# Patient Record
Sex: Female | Born: 1970 | Race: White | Hispanic: Yes | Marital: Married | Smoking: Never smoker
Health system: Southern US, Community
[De-identification: ages and names within clinical notes are randomized; demographics above are authoritative.]

## PROBLEM LIST (undated history)

## (undated) DIAGNOSIS — E119 Type 2 diabetes mellitus without complications: Secondary | ICD-10-CM

## (undated) DIAGNOSIS — IMO0001 Reserved for inherently not codable concepts without codable children: Secondary | ICD-10-CM

## (undated) HISTORY — DX: Type 2 diabetes mellitus without complications: E11.9

## (undated) HISTORY — DX: Reserved for inherently not codable concepts without codable children: IMO0001

---

## 2006-01-07 ENCOUNTER — Emergency Department (HOSPITAL_COMMUNITY): Admission: EM | Admit: 2006-01-07 | Discharge: 2006-01-08 | Payer: Self-pay | Admitting: Emergency Medicine

## 2006-01-29 ENCOUNTER — Emergency Department (HOSPITAL_COMMUNITY): Admission: EM | Admit: 2006-01-29 | Discharge: 2006-01-29 | Payer: Self-pay | Admitting: Emergency Medicine

## 2006-01-29 ENCOUNTER — Encounter (INDEPENDENT_AMBULATORY_CARE_PROVIDER_SITE_OTHER): Payer: Self-pay | Admitting: Specialist

## 2007-04-25 ENCOUNTER — Inpatient Hospital Stay (HOSPITAL_COMMUNITY): Admission: AD | Admit: 2007-04-25 | Discharge: 2007-04-25 | Payer: Self-pay | Admitting: Obstetrics and Gynecology

## 2007-04-29 ENCOUNTER — Inpatient Hospital Stay (HOSPITAL_COMMUNITY): Admission: AD | Admit: 2007-04-29 | Discharge: 2007-05-01 | Payer: Self-pay | Admitting: Obstetrics and Gynecology

## 2010-11-05 ENCOUNTER — Ambulatory Visit: Payer: Self-pay | Admitting: Internal Medicine

## 2010-11-05 DIAGNOSIS — N912 Amenorrhea, unspecified: Secondary | ICD-10-CM | POA: Insufficient documentation

## 2010-11-05 LAB — CONVERTED CEMR LAB: Beta hcg, urine, semiquantitative: POSITIVE

## 2010-12-24 LAB — ABO/RH: RH Type: POSITIVE

## 2010-12-24 LAB — HEPATITIS B SURFACE ANTIGEN: Hepatitis B Surface Ag: NEGATIVE

## 2010-12-24 LAB — TYPE AND SCREEN: Antibody Screen: NEGATIVE

## 2010-12-24 LAB — RUBELLA ANTIBODY, IGM: Rubella: IMMUNE

## 2010-12-24 LAB — HIV ANTIBODY (ROUTINE TESTING W REFLEX): HIV: NONREACTIVE

## 2010-12-27 NOTE — Assessment & Plan Note (Signed)
Summary: new to estab/cbs   Vital Signs:  Patient profile:   40 year old female Height:      63 inches Weight:      183 pounds BMI:     32.53 Pulse rate:   78 / minute Pulse rhythm:   regular BP sitting:   118 / 72  (left arm) Cuff size:   regular  Vitals Entered By: Army Fossa CMA (November 05, 2010 11:02 AM) CC: CPX, fasting Comments no pap, Pregnant, refer to gyn?    History of Present Illness: new patient  was supposed to get a CPX but just found out she is pregnant.Marland KitchenMarland KitchenLMP Oct 27 c/o chronic constipation uses OTCs   Preventive Screening-Counseling & Management  Alcohol-Tobacco     Smoking Status: never  Caffeine-Diet-Exercise     Does Patient Exercise: no      Drug Use:  no.        Blood Transfusions:  yes.    Current Medications (verified): 1)  None  Allergies (verified): No Known Drug Allergies  Past History:  Family History: Last updated: 11/05/2010 Hypertension-- M DM--no breast ca--cousin  colon ca--no  Social History: Last updated: 11/05/2010 married 4 children from [place] Never Smoked Alcohol use-no Drug use-no Regular exercise-no  Risk Factors: Exercise: no (11/05/2010)  Risk Factors: Smoking Status: never (11/05/2010)  Past Medical History: G5P4 Blood transfusion w/ first delivery   Past Surgical History: no major   Family History: Hypertension-- M DM--no breast ca--cousin  colon ca--no  Social History: married 4 children from [place] Never Smoked Alcohol use-no Drug use-no Regular exercise-no Blood Transfusions:  yes Smoking Status:  never Drug Use:  no Does Patient Exercise:  no  Review of Systems General:  Denies fatigue and fever. GI:  Denies bloody stools and vomiting; mild nausea . GU:  no vag d/c or bleed no abdominal cramps .  Physical Exam  General:  alert and well-developed.   Lungs:  normal respiratory effort, no intercostal retractions, no accessory muscle use, and normal breath  sounds.   Heart:  normal rate, regular rhythm, and no murmur.   Abdomen:  soft, no distention, no masses, no guarding, and no rigidity.  mild suprapubic dyscomfort w/o mass , rebound    Impression & Recommendations:  Problem # 1:  AMENORRHEA (ICD-626.0)  see instructions   Orders: Urine Pregnancy Test  (13086)  Problem # 2:  h/o constipation fluids , fiber intake encouraged: fruits- vegetables   Patient Instructions: 1)  please see Dr Senaida Ores as soon as possible, is very important that you het prenatal care  2)  call if you can't get an appointment  3)  start taking a pre natal vitamin  4)  vea a la doctora Richardson lo antes possible, es [employer] tenga su  control prenatal. 5)  Si no puede sacar una cita llamenos  6)  empieze a tomar una vitamina pre-natal diariamente    Orders Added: 1)  New Patient Level II [99202] 2)  Urine Pregnancy Test  [81025]     Risk Factors:  Tobacco use:  never Drug use:  no Alcohol use:  no Exercise:  no   Laboratory Results   Urine Tests      Urine HCG: positive Comments: Army Fossa CMA  November 05, 2010 2:46 PM

## 2011-03-09 ENCOUNTER — Ambulatory Visit (HOSPITAL_COMMUNITY)
Admission: RE | Admit: 2011-03-09 | Discharge: 2011-03-09 | Disposition: A | Discharge status: Home / Self Care | Payer: 59 | Source: Intra-hospital | Attending: Family Medicine | Admitting: Family Medicine

## 2011-03-09 DIAGNOSIS — M7989 Other specified soft tissue disorders: Secondary | ICD-10-CM | POA: Insufficient documentation

## 2011-03-09 DIAGNOSIS — M79609 Pain in unspecified limb: Secondary | ICD-10-CM | POA: Insufficient documentation

## 2011-03-18 ENCOUNTER — Ambulatory Visit (HOSPITAL_COMMUNITY)
Admission: RE | Admit: 2011-03-18 | Discharge: 2011-03-18 | Disposition: A | Discharge status: Home / Self Care | Payer: 59 | Source: Ambulatory Visit | Attending: Obstetrics and Gynecology | Admitting: Obstetrics and Gynecology

## 2011-03-18 ENCOUNTER — Ambulatory Visit (HOSPITAL_COMMUNITY): Payer: Self-pay

## 2011-03-18 DIAGNOSIS — M79609 Pain in unspecified limb: Secondary | ICD-10-CM | POA: Insufficient documentation

## 2011-03-18 DIAGNOSIS — I83893 Varicose veins of bilateral lower extremities with other complications: Secondary | ICD-10-CM | POA: Insufficient documentation

## 2011-03-18 DIAGNOSIS — M7989 Other specified soft tissue disorders: Secondary | ICD-10-CM | POA: Insufficient documentation

## 2011-03-18 LAB — RPR: RPR: NONREACTIVE

## 2011-04-12 NOTE — Discharge Summary (Signed)
NAMEMANNIE, WINELAND             ACCOUNT NO.:  [id number]   MEDICAL RECORD NO.:  [id number]          PATIENT TYPE:  INP   LOCATION:  9139                          FACILITY:  [facility]   PHYSICIAN:  Huel Cote, M.D. DATE OF BIRTH:  [birth year]   DATE OF ADMISSION:  04/29/2007  DATE OF DISCHARGE:  05/01/2007                               DISCHARGE SUMMARY   DISCHARGE DIAGNOSES:  1. A 41+ weeks' gestation delivered.  2. Macrosomic infant.  3. Status post normal spontaneous vaginal delivery with a shoulder      dystocia of a 10 pounds infant.   DISCHARGE MEDICATIONS:  Motrin 600 mg p.o. every 6 hours.   DISCHARGE FOLLOWUP:  The patient is to follow up in the office in 6  weeks for her routine postpartum exam.   HOSPITAL COURSE:  The patient is a 40 year old G5, P3-0-1-3 who was  admitted at 41+ weeks' gestation with spontaneous rupture of membranes  earlier that morning.  She had irregular contractions but was not in  labor.  Prenatal care had been complicated by Group B strep positive  status.  And, she had a history of gestational diabetes with her other  pregnancies but tested normal this pregnancy on a 3-hour test, although  her 1-hour Glucola was elevated.   PRENATAL LABORATORY:  O positive.  Antibody negative.  RPR nonreactive.  Rubella immune.  Hepatitis B surface antigen negative.  HIV nonreactive.  GC negative.  Chlamydia negative.  Group B strep positive.  One-hour  Glucola 163.  Three-hour Glucola within normal limits.   PAST OBSTETRICAL HISTORY:  1. She had a vaginal delivery in 2000 of an 8-pound infant.  2. In 2002, a vaginal delivery of a 7-pound infant.  3. In 2005, a vaginal delivery of an 8-pound infant.  4. She had one spontaneous loss.   PAST GYNECOLOGIC HISTORY:  None.   PAST MEDICAL HISTORY:  None.   PAST SURGICAL HISTORY:  None.   On admission she was afebrile with stable vital signs.  Fetal heart rate  was reassuring but was slightly tachycardic.   The cervix was 55 at a  minus 2 station.  She was placed on Pitocin to augment her labor and  reach complete dilation.  She pushed well with a normal spontaneous  vaginal delivery of a viable female infant over a 1st degree laceration.  There was a shoulder dystocia encountered which was relieved with  McRoberts' and a corkscrew maneuver.  Nuchal cord x1 was reduced over  the head.  Apgar's were 8 and 9.  Weight was 10 pounds even.  Placenta  delivered spontaneously.  Estimated blood loss was 450 mL with mild  atony of the uterus relieved with __________  and massage.  The 1st  degree laceration was repaired with one figure-of-8 suture of 0 Vicryl  for hemostasis.  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  Postpartum day #2, the patient was doing quite well.  The infant was  doing fine and she was felt stable for discharge home.  Her discharge  hemoglobin was normal and the patient  was instructed to follow up in the  office in 6 weeks.      Huel Cote, M.D.  Electronically Signed     KR/MEDQ  D:  06/08/2007  T:  06/08/2007  Job:  [id number]

## 2011-06-18 ENCOUNTER — Other Ambulatory Visit: Payer: Self-pay | Admitting: Obstetrics and Gynecology

## 2011-06-18 NOTE — H&P (Signed)
Amy Davidson is an 40 y.o. female. [id number] at [redacted] weeks gestation (EDD 06/26/2011 by LMP c/w 11 week [place]) presents for scheduled C-Section given a recent US on 06/14/11 which showed a macrosomic baby with an EFW of 10lbs 14oz.  Pt has a hx of an NSVD of a 10lb baby with a shoulder dystocia, and given the EFW >5000g it was felt not safe to proceed with a trial  Of labor.  [place] also showed significant polyhydramnios of 32.  Prenatal care significant for h/o gestational diabetes with last pregnancy, tested early and at 28 weeks this pregnancy and was WNL--pt  However, was still instructed to stay on a low carb diet.  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  She has been followed by NST's 2x weekly. She also tested positive for GBS early in pregnancy.    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 She does not have any smokeless tobacco history on file. Her alcohol and drug histories not on file.    ROS negative  Physical Exam CV RRR Lungs CTA bilaterally Abd  Gravid, FH 47cm Pelvic 3-4/50/-3   Assessment/Plan: Pt is 40 weeks with EFW of 10lbs 14oz  And h/o shoulder dystocia with 10lb baby.  D/w pt that [place] can be off by 20%, but that I really feel a trial of labor is just too risky given her h/o dystocia.  I discussed C/S risks and benefits  with pt and husband translated.  She understands the need  to proceed.  She does not want a tubal ligation.  Amy Davidson 06/18/2011, 9:57 PM

## 2011-06-21 ENCOUNTER — Encounter (HOSPITAL_COMMUNITY)
Admission: RE | Admit: 2011-06-21 | Discharge: 2011-06-21 | Disposition: A | Discharge status: Home / Self Care | Payer: BC Managed Care – PPO | Source: Ambulatory Visit | Attending: Obstetrics and Gynecology | Admitting: Obstetrics and Gynecology

## 2011-06-21 ENCOUNTER — Encounter (HOSPITAL_COMMUNITY): Payer: Self-pay

## 2011-06-21 LAB — CBC
HCT: 38.1 % (ref 36.0–46.0)
Hemoglobin: 13 g/dL (ref 12.0–15.0)
MCH: 31 pg (ref 26.0–34.0)
MCHC: 34.1 g/dL (ref 30.0–36.0)
MCV: 90.7 fL (ref 78.0–100.0)
Platelets: 94 10*3/uL — ABNORMAL LOW (ref 150–400)
RBC: 4.2 MIL/uL (ref 3.87–5.11)
RDW: 14.1 % (ref 11.5–15.5)
WBC: 5.2 10*3/uL (ref 4.0–10.5)

## 2011-06-21 LAB — SURGICAL PCR SCREEN
MRSA, PCR: INVALID — AB
Staphylococcus aureus: INVALID — AB

## 2011-06-21 LAB — RPR: RPR Ser Ql: NONREACTIVE

## 2011-06-21 NOTE — Patient Instructions (Addendum)
20 Amy Davidson  06/21/2011   Your procedure is scheduled on: 06/26/11   Report to [facility] at 1100 AM.  Call this number if you have problems the morning of surgery: [phone]   Remember:   Do not eat food:After Midnight.  Do not drink clear liquids: After Midnight.  Take these medicines the morning of surgery with A SIP OF WATER:NA   Do not wear jewelry, make-up or nail polish.  Do not bring valuables to the hospital.  Contacts, dentures or bridgework may not be worn into surgery.  Leave suitcase in the car. After surgery it may be brought to your room.  For patients admitted to the hospital, checkout time is 11:00 AM the day of discharge.   Patients discharged the day of surgery will not be allowed to drive home.  Name and phone number of your driver: NA  Special Instructions: use special wash 1/2 bottle the night before surgery and 1/2 bottle the morning of surgery  Please read over the following fact sheets that you were given: Pain Booklet and MRSA Information

## 2011-06-23 ENCOUNTER — Encounter (HOSPITAL_COMMUNITY): Payer: Self-pay | Admitting: Anesthesiology

## 2011-06-23 ENCOUNTER — Encounter (HOSPITAL_COMMUNITY)
Admission: AD | Disposition: A | Discharge status: Home / Self Care | Payer: Self-pay | Source: Ambulatory Visit | Attending: Obstetrics and Gynecology

## 2011-06-23 ENCOUNTER — Inpatient Hospital Stay (HOSPITAL_COMMUNITY)
Admission: AD | Admit: 2011-06-23 | Discharge: 2011-06-27 | DRG: 371 | Disposition: A | Discharge status: Home / Self Care | Payer: BC Managed Care – PPO | Source: Ambulatory Visit | Attending: Obstetrics and Gynecology | Admitting: Obstetrics and Gynecology

## 2011-06-23 ENCOUNTER — Encounter (HOSPITAL_COMMUNITY): Payer: Self-pay | Admitting: Obstetrics and Gynecology

## 2011-06-23 ENCOUNTER — Inpatient Hospital Stay (HOSPITAL_COMMUNITY): Payer: BC Managed Care – PPO | Admitting: Anesthesiology

## 2011-06-23 DIAGNOSIS — O3660X0 Maternal care for excessive fetal growth, unspecified trimester, not applicable or unspecified: Secondary | ICD-10-CM | POA: Diagnosis present

## 2011-06-23 DIAGNOSIS — Z01812 Encounter for preprocedural laboratory examination: Secondary | ICD-10-CM

## 2011-06-23 DIAGNOSIS — Z01818 Encounter for other preprocedural examination: Secondary | ICD-10-CM

## 2011-06-23 DIAGNOSIS — O09529 Supervision of elderly multigravida, unspecified trimester: Secondary | ICD-10-CM | POA: Diagnosis present

## 2011-06-23 DIAGNOSIS — O409XX0 Polyhydramnios, unspecified trimester, not applicable or unspecified: Secondary | ICD-10-CM | POA: Diagnosis present

## 2011-06-23 LAB — RPR: RPR Ser Ql: NONREACTIVE

## 2011-06-23 LAB — CBC
HCT: 39.6 % (ref 36.0–46.0)
Hemoglobin: 14 g/dL (ref 12.0–15.0)
MCH: 32.1 pg (ref 26.0–34.0)
MCHC: 35.4 g/dL (ref 30.0–36.0)
MCV: 90.8 fL (ref 78.0–100.0)
Platelets: 106 10*3/uL — ABNORMAL LOW (ref 150–400)
RBC: 4.36 MIL/uL (ref 3.87–5.11)
RDW: 14.2 % (ref 11.5–15.5)
WBC: 7.5 10*3/uL (ref 4.0–10.5)

## 2011-06-23 SURGERY — Surgical Case
Anesthesia: Regional | Site: Abdomen | Wound class: Clean Contaminated

## 2011-06-23 MED ORDER — MEPERIDINE HCL 25 MG/ML IJ SOLN: 6.2500 mg | INTRAMUSCULAR | Status: DC | PRN

## 2011-06-23 MED ORDER — SIMETHICONE 80 MG PO CHEW
80.0000 mg | CHEWABLE_TABLET | Freq: Three times a day (TID) | ORAL | Status: DC
  Administered 2011-06-23 – 2011-06-27 (×12): 80 mg via ORAL

## 2011-06-23 MED ORDER — ACETAMINOPHEN 10 MG/ML IV SOLN
1000.0000 mg | Freq: Four times a day (QID) | INTRAVENOUS | Status: DC | PRN
Start: 2011-06-23 — End: 2011-06-23

## 2011-06-23 MED ORDER — ACETAMINOPHEN 325 MG PO TABS
325.0000 mg | ORAL_TABLET | ORAL | Status: DC | PRN
  Administered 2011-06-26: 325 mg via ORAL
  Filled 2011-06-23: qty 1

## 2011-06-23 MED ORDER — MEASLES, MUMPS & RUBELLA VAC ~~LOC~~ INJ: 0.5000 mL | INJECTION | Freq: Once | SUBCUTANEOUS | Status: DC

## 2011-06-23 MED ORDER — ONDANSETRON HCL 4 MG/2ML IJ SOLN: 4.0000 mg | INTRAMUSCULAR | Status: DC | PRN

## 2011-06-23 MED ORDER — METHYLERGONOVINE MALEATE 0.2 MG/ML IJ SOLN
INTRAMUSCULAR | Status: AC
  Administered 2011-06-23: 0.2 mg via INTRAMUSCULAR
  Filled 2011-06-23: qty 1

## 2011-06-23 MED ORDER — SODIUM CHLORIDE 0.9 % IR SOLN
Status: DC | PRN
  Administered 2011-06-23: 1000 mL

## 2011-06-23 MED ORDER — BUPIVACAINE IN DEXTROSE 0.75-8.25 % IT SOLN
INTRATHECAL | Status: DC | PRN
  Administered 2011-06-23: 11 mg via INTRATHECAL

## 2011-06-23 MED ORDER — KETOROLAC TROMETHAMINE 30 MG/ML IJ SOLN: 30.0000 mg | Freq: Four times a day (QID) | INTRAMUSCULAR | Status: DC | PRN

## 2011-06-23 MED ORDER — CEFAZOLIN SODIUM-DEXTROSE 2-3 GM-% IV SOLR
2.0000 g | INTRAVENOUS | Status: AC
  Administered 2011-06-23: 2 g via INTRAVENOUS

## 2011-06-23 MED ORDER — MENTHOL 3 MG MT LOZG: 1.0000 | LOZENGE | OROMUCOSAL | Status: DC | PRN

## 2011-06-23 MED ORDER — IBUPROFEN 600 MG PO TABS: 600.0000 mg | ORAL_TABLET | Freq: Four times a day (QID) | ORAL | Status: DC | PRN

## 2011-06-23 MED ORDER — LACTATED RINGERS IV SOLN: INTRAVENOUS | Status: DC | PRN

## 2011-06-23 MED ORDER — MAGNESIUM HYDROXIDE 400 MG/5ML PO SUSP: 30.0000 mL | ORAL | Status: DC | PRN

## 2011-06-23 MED ORDER — ONDANSETRON HCL 4 MG PO TABS: 4.0000 mg | ORAL_TABLET | ORAL | Status: DC | PRN

## 2011-06-23 MED ORDER — NALBUPHINE SYRINGE 5 MG/0.5 ML: 5.0000 mg | INJECTION | INTRAMUSCULAR | Status: AC | PRN

## 2011-06-23 MED ORDER — PROMETHAZINE HCL 25 MG/ML IJ SOLN: 6.2500 mg | INTRAMUSCULAR | Status: DC | PRN

## 2011-06-23 MED ORDER — FAMOTIDINE IN NACL 20-0.9 MG/50ML-% IV SOLN
INTRAVENOUS | Status: AC
  Administered 2011-06-23: 20 mg via INTRAVENOUS
  Filled 2011-06-23: qty 50

## 2011-06-23 MED ORDER — DIPHENHYDRAMINE HCL 50 MG/ML IJ SOLN: 12.5000 mg | INTRAMUSCULAR | Status: DC | PRN

## 2011-06-23 MED ORDER — ACETAMINOPHEN 10 MG/ML IV SOLN: 1000.0000 mg | Freq: Four times a day (QID) | INTRAVENOUS | Status: AC | PRN

## 2011-06-23 MED ORDER — HYDROMORPHONE HCL 1 MG/ML IJ SOLN: 0.2500 mg | INTRAMUSCULAR | Status: DC | PRN

## 2011-06-23 MED ORDER — CITRIC ACID-SODIUM CITRATE 334-500 MG/5ML PO SOLN
ORAL | Status: AC
  Administered 2011-06-23: 30 mL via ORAL
  Filled 2011-06-23: qty 15

## 2011-06-23 MED ORDER — IBUPROFEN 600 MG PO TABS
600.0000 mg | ORAL_TABLET | Freq: Four times a day (QID) | ORAL | Status: DC
  Administered 2011-06-26 – 2011-06-27 (×3): 600 mg via ORAL
  Filled 2011-06-23 (×4): qty 1

## 2011-06-23 MED ORDER — DIPHENHYDRAMINE HCL 50 MG/ML IJ SOLN: 25.0000 mg | INTRAMUSCULAR | Status: DC | PRN

## 2011-06-23 MED ORDER — METOCLOPRAMIDE HCL 5 MG/ML IJ SOLN: 10.0000 mg | Freq: Three times a day (TID) | INTRAMUSCULAR | Status: DC | PRN

## 2011-06-23 MED ORDER — DIPHENHYDRAMINE HCL 25 MG PO CAPS: 25.0000 mg | ORAL_CAPSULE | ORAL | Status: DC | PRN

## 2011-06-23 MED ORDER — PRENATAL PLUS 27-1 MG PO TABS
1.0000 | ORAL_TABLET | Freq: Every day | ORAL | Status: DC
  Administered 2011-06-24 – 2011-06-27 (×4): 1 via ORAL
  Filled 2011-06-23 (×4): qty 1

## 2011-06-23 MED ORDER — ACETAMINOPHEN 325 MG PO TABS: 325.0000 mg | ORAL_TABLET | ORAL | Status: DC | PRN

## 2011-06-23 MED ORDER — ONDANSETRON HCL 4 MG/2ML IJ SOLN: 4.0000 mg | Freq: Three times a day (TID) | INTRAMUSCULAR | Status: DC | PRN

## 2011-06-23 MED ORDER — LACTATED RINGERS IV SOLN
INTRAVENOUS | Status: DC | PRN
  Administered 2011-06-23 (×3): via INTRAVENOUS

## 2011-06-23 MED ORDER — WITCH HAZEL-GLYCERIN EX PADS: MEDICATED_PAD | CUTANEOUS | Status: DC | PRN

## 2011-06-23 MED ORDER — OXYTOCIN 20 UNITS IN LACTATED RINGERS INFUSION - SIMPLE
INTRAVENOUS | Status: DC | PRN
  Administered 2011-06-23: 20 [IU] via INTRAVENOUS

## 2011-06-23 MED ORDER — NALBUPHINE HCL 10 MG/ML IJ SOLN: 5.0000 mg | INTRAMUSCULAR | Status: DC | PRN

## 2011-06-23 MED ORDER — OXYCODONE-ACETAMINOPHEN 5-325 MG PO TABS
1.0000 | ORAL_TABLET | ORAL | Status: DC | PRN
  Administered 2011-06-24: 1 via ORAL
  Administered 2011-06-24: 2 via ORAL
  Administered 2011-06-24: 1 via ORAL
  Administered 2011-06-24 (×2): 2 via ORAL
  Administered 2011-06-25 (×2): 1 via ORAL
  Administered 2011-06-25 – 2011-06-26 (×3): 2 via ORAL
  Administered 2011-06-26 – 2011-06-27 (×2): 1 via ORAL
  Filled 2011-06-23 (×6): qty 2
  Filled 2011-06-23 (×4): qty 1
  Filled 2011-06-23: qty 2
  Filled 2011-06-23: qty 1

## 2011-06-23 MED ORDER — SODIUM CHLORIDE 0.9 % IJ SOLN: 3.0000 mL | INTRAMUSCULAR | Status: DC | PRN

## 2011-06-23 MED ORDER — SCOPOLAMINE 1 MG/3DAYS TD PT72: 1.0000 | MEDICATED_PATCH | Freq: Once | TRANSDERMAL | Status: DC

## 2011-06-23 MED ORDER — PANTOPRAZOLE SODIUM 40 MG PO TBEC: 40.0000 mg | DELAYED_RELEASE_TABLET | Freq: Once | ORAL | Status: DC | PRN

## 2011-06-23 MED ORDER — KETOROLAC TROMETHAMINE 30 MG/ML IJ SOLN: 30.0000 mg | Freq: Four times a day (QID) | INTRAMUSCULAR | Status: AC | PRN

## 2011-06-23 MED ORDER — EPHEDRINE SULFATE 50 MG/ML IJ SOLN
INTRAMUSCULAR | Status: DC | PRN
  Administered 2011-06-23: 80 mg via INTRAVENOUS
  Administered 2011-06-23: 40 mg via INTRAVENOUS
  Administered 2011-06-23 (×3): 10 mg via INTRAVENOUS

## 2011-06-23 MED ORDER — PHENYLEPHRINE HCL 10 MG/ML IJ SOLN
INTRAMUSCULAR | Status: DC | PRN
  Administered 2011-06-23 (×2): 80 ug via INTRAVENOUS
  Administered 2011-06-23: 120 ug via INTRAVENOUS
  Administered 2011-06-23 (×4): 80 ug via INTRAVENOUS
  Administered 2011-06-23: 40 ug via INTRAVENOUS

## 2011-06-23 MED ORDER — SODIUM CHLORIDE 0.9 % IV SOLN: 1.0000 ug/kg/h | INTRAVENOUS | Status: DC | PRN

## 2011-06-23 MED ORDER — MORPHINE SULFATE (PF) 0.5 MG/ML IJ SOLN
INTRAMUSCULAR | Status: DC | PRN
  Administered 2011-06-23 (×2): 1 mg via INTRAVENOUS
  Administered 2011-06-23: .9 mg via INTRAVENOUS
  Administered 2011-06-23: 1 mg via INTRAVENOUS
  Administered 2011-06-23: .1 mg via INTRATHECAL
  Administered 2011-06-23: 1 mg via INTRAVENOUS

## 2011-06-23 MED ORDER — FAMOTIDINE 20 MG PO TABS: 20.0000 mg | ORAL_TABLET | Freq: Once | ORAL | Status: DC | PRN

## 2011-06-23 MED ORDER — ACETAMINOPHEN 10 MG/ML IV SOLN: 1000.0000 mg | Freq: Once | INTRAVENOUS | Status: DC | PRN

## 2011-06-23 MED ORDER — SIMETHICONE 80 MG PO CHEW
80.0000 mg | CHEWABLE_TABLET | ORAL | Status: DC | PRN
  Administered 2011-06-24: 80 mg via ORAL

## 2011-06-23 MED ORDER — METHYLERGONOVINE MALEATE 0.2 MG/ML IJ SOLN
0.2000 mg | Freq: Once | INTRAMUSCULAR | Status: AC
  Administered 2011-06-23: 0.2 mg via INTRAMUSCULAR

## 2011-06-23 MED ORDER — DIPHENHYDRAMINE HCL 25 MG PO CAPS: 25.0000 mg | ORAL_CAPSULE | Freq: Four times a day (QID) | ORAL | Status: DC | PRN

## 2011-06-23 MED ORDER — ONDANSETRON HCL 4 MG/2ML IJ SOLN
INTRAMUSCULAR | Status: DC | PRN
  Administered 2011-06-23: 4 mg via INTRAVENOUS

## 2011-06-23 MED ORDER — TETANUS-DIPHTH-ACELL PERTUSSIS 5-2.5-18.5 LF-MCG/0.5 IM SUSP
0.5000 mL | Freq: Once | INTRAMUSCULAR | Status: AC
  Administered 2011-06-24: 0.5 mL via INTRAMUSCULAR
  Filled 2011-06-23: qty 0.5

## 2011-06-23 MED ORDER — ZOLPIDEM TARTRATE 5 MG PO TABS: 5.0000 mg | ORAL_TABLET | Freq: Every evening | ORAL | Status: DC | PRN

## 2011-06-23 MED ORDER — ACETAMINOPHEN 10 MG/ML IV SOLN: 1000.0000 mg | Freq: Once | INTRAVENOUS | Status: AC | PRN

## 2011-06-23 MED ORDER — NALOXONE HCL 0.4 MG/ML IJ SOLN: 0.4000 mg | INTRAMUSCULAR | Status: DC | PRN

## 2011-06-23 MED ORDER — SENNOSIDES-DOCUSATE SODIUM 8.6-50 MG PO TABS
1.0000 | ORAL_TABLET | Freq: Every day | ORAL | Status: DC
  Administered 2011-06-23 – 2011-06-25 (×3): 2 via ORAL
  Administered 2011-06-26: 1 via ORAL

## 2011-06-23 MED ORDER — FENTANYL CITRATE 0.05 MG/ML IJ SOLN
INTRAMUSCULAR | Status: DC | PRN
  Administered 2011-06-23: 75 ug via INTRAVENOUS
  Administered 2011-06-23: 25 ug via INTRATHECAL

## 2011-06-23 MED ORDER — METHYLERGONOVINE MALEATE 0.2 MG/ML IJ SOLN: 0.2000 mg | INTRAMUSCULAR | Status: DC | PRN

## 2011-06-23 MED ORDER — LACTATED RINGERS IV SOLN
INTRAVENOUS | Status: DC
  Administered 2011-06-24: 01:00:00 via INTRAVENOUS

## 2011-06-23 MED ORDER — OXYTOCIN 20 UNITS IN LACTATED RINGERS INFUSION - SIMPLE: 125.0000 mL/h | INTRAVENOUS | Status: AC

## 2011-06-23 MED ORDER — OXYTOCIN 20 UNITS IN LACTATED RINGERS INFUSION - SIMPLE
INTRAVENOUS | Status: AC
  Administered 2011-06-23: 18:00:00
  Filled 2011-06-23: qty 1000

## 2011-06-23 MED ORDER — METHYLERGONOVINE MALEATE 0.2 MG PO TABS: 0.2000 mg | ORAL_TABLET | ORAL | Status: DC | PRN

## 2011-06-23 MED ORDER — CITRIC ACID-SODIUM CITRATE 334-500 MG/5ML PO SOLN: 30.0000 mL | Freq: Once | ORAL | Status: DC | PRN

## 2011-06-23 MED ORDER — MIDAZOLAM HCL 2 MG/2ML IJ SOLN: 0.5000 mg | INTRAMUSCULAR | Status: DC | PRN

## 2011-06-23 SURGICAL SUPPLY — 34 items
CHLORAPREP W/TINT 26ML (MISCELLANEOUS) ×2 IMPLANT
CLOTH BEACON ORANGE TIMEOUT ST (SAFETY) ×2 IMPLANT
CONTAINER PREFILL 10% NBF 15ML (MISCELLANEOUS) IMPLANT
DRAPE UTILITY XL STRL (DRAPES) IMPLANT
DRSG COVADERM 4X10 (GAUZE/BANDAGES/DRESSINGS) ×2 IMPLANT
ELECT REM PT RETURN 9FT ADLT (ELECTROSURGICAL) ×2
ELECTRODE REM PT RTRN 9FT ADLT (ELECTROSURGICAL) ×1 IMPLANT
EXTRACTOR VACUUM KIWI (MISCELLANEOUS) IMPLANT
EXTRACTOR VACUUM M CUP 4 TUBE (SUCTIONS) IMPLANT
GLOVE BIO SURGEON STRL SZ8 (GLOVE) ×2 IMPLANT
GLOVE ORTHO TXT STRL SZ7.5 (GLOVE) ×2 IMPLANT
GLOVE SKINSENSE NS SZ6.5 (GLOVE) ×2
GLOVE SKINSENSE STRL SZ6.5 (GLOVE) ×2 IMPLANT
GOWN PREVENTION PLUS LG XLONG (DISPOSABLE) ×4 IMPLANT
GOWN STRL REIN XL XLG (GOWN DISPOSABLE) ×2 IMPLANT
KIT ABG SYR 3ML LUER SLIP (SYRINGE) IMPLANT
NEEDLE HYPO 25X5/8 SAFETYGLIDE (NEEDLE) ×2 IMPLANT
NS IRRIG 1000ML POUR BTL (IV SOLUTION) ×2 IMPLANT
PACK C SECTION WH (CUSTOM PROCEDURE TRAY) ×2 IMPLANT
RTRCTR C-SECT PINK 25CM LRG (MISCELLANEOUS) ×2 IMPLANT
SLEEVE SCD COMPRESS KNEE MED (MISCELLANEOUS) ×2 IMPLANT
STAPLER VISISTAT 35W (STAPLE) ×2 IMPLANT
SUT CHROMIC 1 CTX 36 (SUTURE) ×4 IMPLANT
SUT PLAIN 0 NONE (SUTURE) IMPLANT
SUT PLAIN 2 0 XLH (SUTURE) ×2 IMPLANT
SUT PROLENE 4 0 KS NEEDLE (SUTURE) IMPLANT
SUT VIC AB 0 CT1 27 (SUTURE) ×2
SUT VIC AB 0 CT1 27XBRD ANBCTR (SUTURE) ×2 IMPLANT
SUT VIC AB 2-0 SH 27 (SUTURE) ×1
SUT VIC AB 2-0 SH 27XBRD (SUTURE) ×1 IMPLANT
TAPE CLOTH SURG 4X10 WHT LF (GAUZE/BANDAGES/DRESSINGS) ×2 IMPLANT
TOWEL OR 17X24 6PK STRL BLUE (TOWEL DISPOSABLE) ×2 IMPLANT
TRAY FOLEY CATH 14FR (SET/KITS/TRAYS/PACK) ×2 IMPLANT
WATER STERILE IRR 1000ML POUR (IV SOLUTION) ×2 IMPLANT

## 2011-06-23 NOTE — H&P (Addendum)
Amy Davidson is a 40 y.o. female presenting for regular ctx.  Scheduled for c-section on Aug 1 for suspected fetal macrosomia, EFW on 7-20 10 lbs 14 oz by u/s.  . Maternal Medical History:  Reason for admission: Reason for admission: contractions.  Contractions: Onset was 1-2 hours ago.   Frequency: regular.    Fetal activity: Perceived fetal activity is normal.    Prenatal complications: Polyhydramnios.   Suspected fetal macrosomia    OB History    Grav Para Term Preterm Abortions TAB SAB Ect Mult Living   5 4 4       4      Past Medical History  Diagnosis Date  . Diabetes mellitus     with first preg only   Past Surgical History  Procedure Date  . No past surgeries    Family History: family history is not on file. Social History:  reports that she has never smoked. She has never used smokeless tobacco. She reports that she does not drink alcohol or use illicit drugs.  ROS- normal complaints of pregnancy  Dilation: 7.5 Effacement (%): 90 Station: -1 Exam by:: J.RAschRN bag of water felt on cervical exam  Blood pressure 105/71, pulse 87, temperature 98.1 F (36.7 C), temperature source Oral, resp. rate 20, height 5\' 3"  (1.6 m), weight 97.977 kg (216 lb). Maternal Exam:  Abdomen: Patient reports no abdominal tenderness. Fundal height is large.   Estimated fetal weight is 10-11 lbs.   Fetal presentation: vertex  Introitus: Normal vulva. Normal vagina.  Ferning test: not done.  Nitrazine test: not done. Amniotic fluid character: not assessed.     Physical Exam  Constitutional: She appears well-developed and well-nourished.  Neck: Neck supple. No thyromegaly present.  Cardiovascular: Normal rate and regular rhythm.   Respiratory: Effort normal and breath sounds normal.    Prenatal labs: ABO, Rh:  O pos  Antibody:  neg Rubella:  Imm RPR: NON REACTIVE (07/27 1212)  HBsAg:   Neg HIV:   Neg GBS:  Neg   Assessment/Plan: IUP at 39 weeks in active labor.   Suspected fetal macrosomia, scheduled for c-section.  Will proceed with c-section.  Husband as interpreter says she understands.     Amy Davidson D 06/23/2011, 1:36 PM

## 2011-06-23 NOTE — Plan of Care (Signed)
Problem: Consults Goal: Postpartum Patient Education (See Patient Education module for education specifics.) Outcome: Progressing w/interpreter  Problem: Phase I Progression Outcomes Goal: Pain controlled with appropriate interventions Outcome: Progressing w/interpreter Goal: Voiding adequately Outcome: Progressing F/c in place Goal: IS, TCDB as ordered Outcome: Progressing Teaching done w/interpreter

## 2011-06-23 NOTE — Progress Notes (Signed)
Dr. Jackelyn Knife notified of cervical exam. Dr. Is going into surgery and will plan to take pt for a c-section when finished. Pt has C-section scheduled for Aug 1st. Pt is in labor

## 2011-06-23 NOTE — Anesthesia Postprocedure Evaluation (Signed)
  Anesthesia Post-op Note  Patient: Amy Davidson  Procedure(s) Performed:  CESAREAN SECTION - Primary cesarean section with delivery of baby boy at 38. Apgars 8/9.  Patient Location: PACU  Anesthesia Type: Spinal  Level of Consciousness: awake  Airway and Oxygen Therapy: Patient Spontanous Breathing  Post-op Pain: none  Post-op Assessment: Post-op Vital signs reviewed, Patient's Cardiovascular Status Stable and Respiratory Function Stable  Post-op Vital Signs: stable  Complications: No apparent anesthesia complications

## 2011-06-23 NOTE — Anesthesia Procedure Notes (Addendum)
Spinal Block  Patient location during procedure: OR Start time: 06/23/2011 2:44 PM Staffing Anesthesiologist: Brayton Caves R Performed by: anesthesiologist  Preanesthetic Checklist Completed: patient identified, site marked, surgical consent, pre-op evaluation, timeout performed, IV checked, risks and benefits discussed and monitors and equipment checked Spinal Block Patient position: sitting Prep: DuraPrep Patient monitoring: heart rate, cardiac monitor, continuous pulse ox and blood pressure Approach: midline Location: L3-4 Injection technique: single-shot Needle Needle type: Sprotte  Needle gauge: 24 G Needle length: 9 cm Assessment Sensory level: T4 Additional Notes Patient identified.  Risk benefits discussed including failed block, incomplete pain control, headache, nerve damage, paralysis, blood pressure changes, nausea, vomiting, reactions to medication both toxic or allergic, and postpartum back pain.  Patient expressed understanding and wished to proceed.  All questions were answered.  Sterile technique used throughout procedure.  CSF was clear.  No parasthesia or other complications.  Please see nursing notes for vital signs.

## 2011-06-23 NOTE — Anesthesia Preprocedure Evaluation (Deleted)
Anesthesia Evaluation  General Assessment Comment  Airway       Dental   Pulmonary      Cardiovascular    Neuro/Psych  GI/Hepatic/Renal   Endo/Other   (+) Diabetes mellitus-  Abdominal   Musculoskeletal  Hematology   Peds  Reproductive/Obstetrics   Anesthesia Other Findings             Anesthesia Physical Anesthesia Plan Anesthesia Quick Evaluation

## 2011-06-23 NOTE — Anesthesia Preprocedure Evaluation (Addendum)
Anesthesia Evaluation  Name, MR# and DOB Patient awake  General Assessment Comment  Reviewed: Allergy & Precautions, H&P  and Patient's Chart, lab work & pertinent test results  Airway Mallampati: III TM Distance: >3 FB Neck ROM: full    Dental No notable dental hx    Pulmonaryneg pulmonary ROS    clear to auscultation  pulmonary exam normal   Cardiovascular regular Normal   Neuro/PsychNegative Neurological ROS Negative Psych ROS  GI/Hepatic/Renal negative GI ROS, negative Liver ROS, and negative Renal ROS (+)       Endo/Other  Negative Endocrine ROS (+) Diabetes mellitus- Morbid obesity Abdominal   Musculoskeletal  Hematology negative hematology ROS (+)   Peds  Reproductive/Obstetrics (+) Pregnancy   Anesthesia Other Findings             Anesthesia Physical Anesthesia Plan  ASA: III  Anesthesia Plan: Spinal   Post-op Pain Management:    Induction:   Airway Management Planned:   Additional Equipment:   Intra-op Plan:   Post-operative Plan:   Informed Consent: I have reviewed the patients History and Physical, chart, labs and discussed the procedure including the risks, benefits and alternatives for the proposed anesthesia with the patient or authorized representative who has indicated his/her understanding and acceptance.     Plan Discussed with:   Anesthesia Plan Comments:        Anesthesia Quick Evaluation

## 2011-06-23 NOTE — Transfer of Care (Signed)
Immediate Anesthesia Transfer of Care Note  Patient: Amy Davidson  Procedure(s) Performed:  CESAREAN SECTION - Primary cesarean section with delivery of baby boy at 19. Apgars 8/9.  Patient Location: PACU  Anesthesia Type: Spinal  Level of Consciousness: awake, alert  and oriented  Airway & Oxygen Therapy: Patient Spontanous Breathing  Post-op Assessment: Report given to PACU RN and Post -op Vital signs reviewed and stable  Post vital signs: stable  Complications: No apparent anesthesia complications

## 2011-06-23 NOTE — Procedures (Signed)
Preoperative diagnosis: Intrauterine pregnancy at 39 weeks, suspected fetal macrosomia Postoperative diagnosis: Intrauterine pregnancy at 39 weeks, fetal macrosomia Procedure: Primary low transverse cesarean section without extensions Surgeon: Lavina Hamman M.D. Anesthesia: Spinal Findings: Normal gravid anatomy and delivery of a viable female infant with Apgars of 8 and 9 the weight 12 lbs. 6 oz. in the vertex presentation Specimens: Placenta sent for to labor and delivery Estimated blood loss: 800 cc Complications: None Procedure in detail:  Patient was taken to the operating room and placed in the sitting position. Dr. Brayton Caves instilled spinal anesthesia and she was then placed in the dorsosupine position with left lateral tilt. Abdomen was prepped and draped in the usual sterile fashion and a Foley catheter was inserted. The level of her anesthesia was found to be adequate. Abdomen was entered via a standard Pfannenstiel incision without difficulty. The Alexis disposable self-retaining retractor was placed and good visualization was achieved. A 4 cm transverse incision was then made in the lower uterine segment pushing the bladder inferior. Once the uterine cavity was entered the incision was extended digitally. Membranes were ruptured revealing copious light meconium-stained fluid. The fetal vertex was at the incision. Fetal vertex was delivered atraumatically. Mouth and nares were suctioned. The remainder of the infant then delivered atraumatically. Cord was doubly clamped and cut and the infant handed to the awaiting pediatric team. Cord blood was obtained. The placenta delivered spontaneously. Uterus was wiped dry with clean lap pad and all clots and debris were removed. Uterine incision was inspected and found to be free of extensions. Uterine incision was closed in 2 layers. The first layer was a running locking layer with #1 chromic the second layer was an imbricating layer also with #1  chromic. Bleeding from 2 separate spots was controlled with interrupted figure-of-eight sutures of 3-0 Vicryl this achieved hemostasis. Tubes and ovaries were inspected and found to be normal. Blood and fluid were removed from each paracolic gutter. The uterine incision was inspected and found to be hemostatic. The Alexis retractor was removed. The subfascial space was irrigated and made hemostatic with electrocautery. Fascia was closed in running fashion starting at both ends and meeting in the middle with 0 Vicryl. Subcutaneous tissue was irrigated and made hemostatic with the cautery. Subcutaneous tissue was closed with running 2-0 plain gut suture. Skin was then closed with staples followed by a sterile dressing. Patient tolerated the procedure well and was taken to the recovery room in stable condition. Counts were correct x2, she received Ancef 2 g IV the beginning of the procedure and she had PAS hose on throughout the procedure.

## 2011-06-23 NOTE — Anesthesia Postprocedure Evaluation (Signed)
  Anesthesia Post-op Note  Patient: Amy Davidson  Procedure(s) Performed:  CESAREAN SECTION - Primary cesarean section with delivery of baby boy at 74. Apgars 8/9. Patient's cardiopulmonary status is stable Patient's level of consciousness: sedate but responsive verbally Pain and nausea are all reasonably controlled No anesthetic complications apparent at this time No follow up care necessary at this time

## 2011-06-23 NOTE — ED Notes (Signed)
Awaiting platelet count results.

## 2011-06-23 NOTE — Progress Notes (Signed)
Pt states her contractions started this morning at 10:00. Husband at bedside assisting with interpretation. Interpretor called.

## 2011-06-24 LAB — CBC
HCT: 32.1 % — ABNORMAL LOW (ref 36.0–46.0)
Hemoglobin: 10.7 g/dL — ABNORMAL LOW (ref 12.0–15.0)
MCH: 30.6 pg (ref 26.0–34.0)
MCHC: 33.3 g/dL (ref 30.0–36.0)
MCV: 91.7 fL (ref 78.0–100.0)
Platelets: 83 10*3/uL — ABNORMAL LOW (ref 150–400)
RBC: 3.5 MIL/uL — ABNORMAL LOW (ref 3.87–5.11)
RDW: 14.6 % (ref 11.5–15.5)
WBC: 16.9 10*3/uL — ABNORMAL HIGH (ref 4.0–10.5)

## 2011-06-24 LAB — MRSA CULTURE

## 2011-06-24 NOTE — Progress Notes (Signed)
Subjective: Postpartum Day#1: Cesarean Delivery Patient reports tolerating PO.  Received IM Methergine in PACU, now on PO Methergine for bleeding which has improved.  Objective: Vital signs in last 24 hours: Temp:  [97 F (36.1 C)-98.5 F (36.9 C)] 97.8 F (36.6 C) (07/30 0800) Pulse Rate:  [56-102] 89  (07/30 0800) Resp:  [16-24] 20  (07/30 0800) BP: (98-154)/(58-89) 110/75 mmHg (07/30 0800) SpO2:  [96 %-99 %] 96 % (07/30 0800) Weight:  [97.977 kg (216 lb)] 216 lb (97.977 kg) (07/29 1317)  Physical Exam:  General: alert Uterine Fundus: firm Incision: dressing C/D/I .   Basename 06/24/11 0505 06/23/11 1312  HGB 10.7* 14.0  HCT 32.1* 39.6    Assessment/Plan: Status post Cesarean section. Doing well postoperatively.  Continue current care, ambulate.  Chala Gul D 06/24/2011, 8:53 AM

## 2011-06-25 MED ORDER — BISACODYL 10 MG RE SUPP
10.0000 mg | Freq: Once | RECTAL | Status: AC
  Administered 2011-06-25: 10 mg via RECTAL

## 2011-06-25 NOTE — Progress Notes (Signed)
POD#2 c/s  Doing ok, pain ok Afeb, VSS Abd- soft, fundus firm, NT at U+2, dressing C/D/I Continue routine care, may shower and remove bandage today

## 2011-06-25 NOTE — Progress Notes (Signed)
Mom recently fed infant at 0947 Breast 10 min and bottle afterwards. Will f/u for latch check.

## 2011-06-26 ENCOUNTER — Encounter (HOSPITAL_COMMUNITY): Admission: RE | Payer: Self-pay | Source: Ambulatory Visit

## 2011-06-26 ENCOUNTER — Encounter (HOSPITAL_COMMUNITY): Payer: Self-pay | Admitting: Obstetrics and Gynecology

## 2011-06-26 ENCOUNTER — Inpatient Hospital Stay (HOSPITAL_COMMUNITY)
Admission: RE | Admit: 2011-06-26 | Payer: BC Managed Care – PPO | Source: Ambulatory Visit | Admitting: Obstetrics and Gynecology

## 2011-06-26 DIAGNOSIS — O3660X0 Maternal care for excessive fetal growth, unspecified trimester, not applicable or unspecified: Secondary | ICD-10-CM | POA: Diagnosis present

## 2011-06-26 SURGERY — Surgical Case
Anesthesia: *Unknown

## 2011-06-26 SURGERY — Surgical Case
Anesthesia: Regional

## 2011-06-26 NOTE — Progress Notes (Signed)
06/26/2011 Amy Davidson  Interpreter  I assisted Dr. Ronalee Red with explanation of plan of care for the [facility]

## 2011-06-26 NOTE — Progress Notes (Signed)
POD#3 c-section Doing well Tm 100.8 at 2145 yesterday, Afeb now, VSS Abd- soft, fundus firm, incision intact-no erythema Due to fever last pm will monitor today for further fever.  Will need abx if has another temp >100.4.

## 2011-06-26 NOTE — Progress Notes (Signed)
With Dad translating, Mom reports that BF is going well,. Experienced BF Mom.  No questions at present. To call prn

## 2011-06-26 NOTE — Progress Notes (Signed)
06/26/2011 Amy Davidson  Interpreter  I assisted [name] with questions

## 2011-06-27 MED ORDER — OXYCODONE-ACETAMINOPHEN 5-325 MG PO TABS: 1.0000 | ORAL_TABLET | ORAL | Status: AC | PRN

## 2011-06-27 MED ORDER — IBUPROFEN 600 MG PO TABS: 600.0000 mg | ORAL_TABLET | Freq: Four times a day (QID) | ORAL | Status: AC

## 2011-06-27 NOTE — Progress Notes (Signed)
  POD#4 c-section No problems Afeb, VSS Abd- soft, fundus firm, incision intact, no erythema Discharge home

## 2011-06-27 NOTE — Discharge Summary (Signed)
Obstetric Discharge Summary Reason for Admission: onset of labor Prenatal Procedures: NST Intrapartum Procedures: cesarean: low cervical, transverse Postpartum Procedures: none Complications-Operative and Postpartum: none Hemoglobin  Date Value Range Status  06/24/2011 10.7* 12.0-15.0 (g/dL) Final     DELTA CHECK NOTED     REPEATED TO VERIFY     HCT  Date Value Range Status  06/24/2011 32.1* 36.0-46.0 (%) Final    Discharge Diagnoses: Term Pregnancy-delivered  Discharge Information: Date: 06/27/2011 Activity: pelvic rest and no strenuous activity Diet: routine Medications: Ibuprophen and Percocet Condition: stable Instructions: refer to practice specific booklet Discharge to: home Follow-up Information    Follow up with Amy Davidson. Make an appointment in 2 weeks.   Contact information:   [address], [address] [place] [address] [phone]          Newborn Data: Live born female  Birth Weight: 12 lb 6.2 oz (5619 g) APGAR: 8, 9  Home with mother.  Amy Davidson 06/27/2011, 8:19 AM

## 2011-08-09 ENCOUNTER — Encounter (HOSPITAL_COMMUNITY): Payer: Self-pay | Admitting: *Deleted

## 2011-08-20 ENCOUNTER — Encounter (HOSPITAL_COMMUNITY): Payer: Self-pay | Admitting: Obstetrics and Gynecology

## 2011-09-12 LAB — CBC
HCT: 33.9 — ABNORMAL LOW
Hemoglobin: 11.4 — ABNORMAL LOW
MCHC: 33.6
MCV: 91.5
Platelets: 150
RBC: 3.71 — ABNORMAL LOW
RDW: 14
WBC: 12.5 — ABNORMAL HIGH

## 2011-09-12 LAB — RPR: RPR Ser Ql: NONREACTIVE

## 2012-05-04 ENCOUNTER — Telehealth: Payer: Self-pay | Admitting: Internal Medicine

## 2012-05-04 ENCOUNTER — Encounter: Payer: Self-pay | Admitting: Internal Medicine

## 2012-05-04 ENCOUNTER — Ambulatory Visit (INDEPENDENT_AMBULATORY_CARE_PROVIDER_SITE_OTHER): Payer: BC Managed Care – PPO | Admitting: Internal Medicine

## 2012-05-04 VITALS — BP 130/80 | HR 75 | Temp 98.9°F | Wt 184.0 lb

## 2012-05-04 DIAGNOSIS — R079 Chest pain, unspecified: Secondary | ICD-10-CM

## 2012-05-04 DIAGNOSIS — R002 Palpitations: Secondary | ICD-10-CM

## 2012-05-04 NOTE — Telephone Encounter (Signed)
Noted  

## 2012-05-04 NOTE — Assessment & Plan Note (Addendum)
Presents today with CP, palpitations, very low CV risk profile Exam is (-), EKG NSR w/ a pause. Sx are associated w/ some anxiety. Plan: Labs Observation, we discussed need to control anxiety but doesn't have persistent sx thus SSRI are not  indicated necessarily; she is not on BCP thus i'm reluctant to use benzos. rec counseling and call if sx increase

## 2012-05-04 NOTE — Progress Notes (Signed)
  Subjective:    Patient ID: Amy Davidson, female    DOB: November 21, 1971, 41 y.o.   MRN: [id number]  HPI Acute visit Had a single episode of chest pain 5 days ago, it lasted one hour, it happened at night, no radiation, located at the left side of the chest and resolved spontaneously. There was no associated shortness of breath, diaphoresis, nausea or vomiting. After the chest pain resolved, her hands got cold, she felt really tired and she felt that she could pass out. No loss of consciousness. She admits that that night she was more anxious than usual. No further events.   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 Blood transfusion w/ first delivery   Past Surgical History: c section  Family History: Hypertension-- M DM--no CAD-- no Stroke-- no breast ca--cousin  colon ca--no  Social History: Married, 5 children, from [place] Never Smoked Alcohol use-no Drug use-no   Review of Systems Denies fever or chills No cough  No abdominal pain No recent airplane trips, no lower extremity edema or calf pain  The patient reports that in the past, there is times that she wakes up feeling scared and has palpitations, symptoms are on and off and going on for a while; these episodes are not associated w/ other sx     Objective:   Physical Exam General -- alert, well-developed, and overweight appearing. No apparent distress.  Neck --no thyromegaly Lungs -- normal respiratory effort, no intercostal retractions, no accessory muscle use, and normal breath sounds.   Heart-- normal rate, regular rhythm, no murmur, and no gallop.   Abdomen--soft, non-tender, no distention, no masses, no HSM, no guarding, and no rigidity.   Extremities-- no pretibial edema bilaterally  Neurologic-- alert & oriented X3 and strength normal in all extremities. Psych-- Cognition and judgment appear intact. Alert and cooperative with normal attention span and concentration.  not anxious appearing and not  depressed appearing.       Assessment & Plan:

## 2012-05-04 NOTE — Telephone Encounter (Signed)
Emergent [facility] Caller: Trinna Post Alsaro/Child; PCP: Willow Ora; CB#: [phone]; Call regarding Chest Pain/Chest Discomfort; onset 04/29/12, heart was racing, with shoulder pain, follows cleaning, no change in activity. states lasted ~ 1 hr, none since. Last OV during pregnancy over 10 months ago for problems with varicose veins, states was told to go for testing. Symptoms reviewed Chest Pain Guideline, with symptoms last week, appt 1530 05/04/12 with Dr Drue Novel. Pt agrees to appt.

## 2012-05-05 LAB — CBC WITH DIFFERENTIAL/PLATELET
Basophils Absolute: 0 10*3/uL (ref 0.0–0.1)
Basophils Relative: 0.4 % (ref 0.0–3.0)
Eosinophils Absolute: 0.1 10*3/uL (ref 0.0–0.7)
Eosinophils Relative: 1.9 % (ref 0.0–5.0)
HCT: 38.6 % (ref 36.0–46.0)
Hemoglobin: 12.6 g/dL (ref 12.0–15.0)
Lymphocytes Relative: 35.5 % (ref 12.0–46.0)
Lymphs Abs: 2.4 10*3/uL (ref 0.7–4.0)
MCHC: 32.7 g/dL (ref 30.0–36.0)
MCV: 90.3 fl (ref 78.0–100.0)
Monocytes Absolute: 0.4 10*3/uL (ref 0.1–1.0)
Monocytes Relative: 6.2 % (ref 3.0–12.0)
Neutro Abs: 3.8 10*3/uL (ref 1.4–7.7)
Neutrophils Relative %: 56 % (ref 43.0–77.0)
Platelets: 191 10*3/uL (ref 150.0–400.0)
RBC: 4.27 Mil/uL (ref 3.87–5.11)
RDW: 13.6 % (ref 11.5–14.6)
WBC: 6.8 10*3/uL (ref 4.5–10.5)

## 2012-05-05 LAB — TSH: TSH: 1.91 u[IU]/mL (ref 0.35–5.50)

## 2012-05-05 LAB — BASIC METABOLIC PANEL
BUN: 12 mg/dL (ref 6–23)
CO2: 29 mEq/L (ref 19–32)
Calcium: 9.2 mg/dL (ref 8.4–10.5)
Chloride: 108 mEq/L (ref 96–112)
Creatinine, Ser: 0.7 mg/dL (ref 0.4–1.2)
GFR: 101.15 mL/min (ref >60.00)
Glucose, Bld: 90 mg/dL (ref 70–99)
Potassium: 3.9 mEq/L (ref 3.5–5.1)
Sodium: 142 mEq/L (ref 135–145)

## 2012-05-06 ENCOUNTER — Encounter: Payer: Self-pay | Admitting: Internal Medicine

## 2012-05-06 NOTE — Assessment & Plan Note (Signed)
See "CP"

## 2012-10-15 ENCOUNTER — Encounter: Payer: Self-pay | Admitting: Family Medicine

## 2012-10-15 ENCOUNTER — Ambulatory Visit (INDEPENDENT_AMBULATORY_CARE_PROVIDER_SITE_OTHER): Payer: BC Managed Care – PPO | Admitting: Family Medicine

## 2012-10-15 VITALS — BP 108/72 | HR 74 | Temp 98.1°F | Wt 183.2 lb

## 2012-10-15 DIAGNOSIS — S43499A Other sprain of unspecified shoulder joint, initial encounter: Secondary | ICD-10-CM

## 2012-10-15 DIAGNOSIS — S46819A Strain of other muscles, fascia and tendons at shoulder and upper arm level, unspecified arm, initial encounter: Secondary | ICD-10-CM

## 2012-10-15 MED ORDER — CYCLOBENZAPRINE HCL 10 MG PO TABS: 10.0000 mg | ORAL_TABLET | Freq: Three times a day (TID) | ORAL | Status: DC | PRN

## 2012-10-15 NOTE — Patient Instructions (Addendum)
Muscle Strain Muscle strain occurs when a muscle is stretched beyond its normal length. A small number of muscle fibers generally are torn. This is especially common in athletes. This happens when a sudden, violent force placed on a muscle stretches it too far. Usually, recovery from muscle strain takes 1 to 2 weeks. Complete healing will take 5 to 6 weeks.  HOME CARE INSTRUCTIONS   While awake, apply ice to the sore muscle for the first 2 days after the injury.  Put ice in a plastic bag.  Place a towel between your skin and the bag.  Leave the ice on for 15 to 20 minutes each hour.  Do not use the strained muscle for several days, until you no longer have pain.  You may wrap the injured area with an elastic bandage for comfort. Be careful not to wrap it too tightly. This may interfere with blood circulation or increase swelling.  Only take over-the-counter or prescription medicines for pain, discomfort, or fever as directed by your caregiver. SEEK MEDICAL CARE IF:  You have increasing pain or swelling in the injured area. MAKE SURE YOU:   Understand these instructions.  Will watch your condition.  Will get help right away if you are not doing well or get worse. Document Released: 11/11/2005 Document Revised: 02/03/2012 Document Reviewed: 11/23/2011 [facility] Patient Information 2013 [place], [place].     Distensin muscular (Muscle Strain)  Un esguince muscular se produce cuando un msculo se estira ms all de su largo normal. Generalmente causa la ruptura de algunas fibras musculares. Es muy frecuente [employer]. Ocurre cuando se aplica una fuerza violenta y sbita sobre un msculo y ste se Public relations account executive. En general, la recuperacin de un esguince muscular demora entre 1 y 2 semanas. La curacin completa se produce luego de 5 a 6 semanas.  INSTRUCCIONES PARA EL CUIDADO DOMICILIARIO  Mientras se encuentre despierto, aplique hielo sobre el msculo dolorido durante los  primeros 2 das luego de la lesin.  Ponga el hielo en una bolsa plstica.  Colquese una toalla entre la piel y la bolsa de hielo.  Deje el hielo durante 15 a 20 minutos por hora.  No use el msculo que sufri el esguince durante [address], hasta que no sienta ms dolor.  Puede vendarse la zona lesionada con una venda elstica para obtener mayor comodidad. Tenga cuidado de no ajustarla demasiado. Esto puede interferir con la circulacin sangunea o aumentar la hinchazn.  Slo tome medicamentos de venta libre o recetados para Primary school teacher, las molestias o bajar la fiebre segn las indicaciones de su mdico. SOLICITE ATENCIN MDICA SI: El dolor o la hinchazn en la zona afectada aumentan. EST SEGURO QUE:   Comprende las instrucciones para el alta mdica.  Controlar su enfermedad.  Solicitar atencin mdica de inmediato segn las indicaciones. Document Released: 08/21/2005 Document Revised: 02/03/2012 [facility] Patient Information 2013 [place], [place].

## 2012-10-16 DIAGNOSIS — S46819A Strain of other muscles, fascia and tendons at shoulder and upper arm level, unspecified arm, initial encounter: Secondary | ICD-10-CM | POA: Insufficient documentation

## 2012-10-16 NOTE — Progress Notes (Signed)
  Subjective:    Patient ID: Amy Davidson, female    DOB: 1971/11/06, 41 y.o.   MRN: [id number]  HPIpt here with translater c/o pain in R trap and upper right back.  No known injury.  Pt lifts a lot of heavy things at work.  No numbness or tingling.      Review of Systems As above    Objective:   Physical Exam  BP 108/72  Pulse 74  Temp 98.1 F (36.7 C) (Oral)  Wt 183 lb 3.2 oz (83.099 kg)  SpO2 99% General appearance: alert, cooperative, appears stated age and no distress Heart: S1, S2 normal Extremities: extremities normal, atraumatic, no cyanosis or edema Neurologic: Alert and oriented X 3, normal strength and tone. Normal symmetric reflexes. Normal coordination and gait Ms --+ muscle spasms R Trap--- myofascial tech used to attempt to release--- not successful      Assessment & Plan:

## 2012-10-16 NOTE — Assessment & Plan Note (Signed)
Flexeril  nsaids Heat  Massage Pt would like to see a chiropractor--- referral put in

## 2013-05-12 ENCOUNTER — Encounter: Payer: Self-pay | Admitting: *Deleted

## 2013-05-12 ENCOUNTER — Ambulatory Visit (INDEPENDENT_AMBULATORY_CARE_PROVIDER_SITE_OTHER): Payer: BC Managed Care – PPO | Admitting: Internal Medicine

## 2013-05-12 VITALS — BP 114/78 | HR 80 | Temp 98.1°F | Wt 184.0 lb

## 2013-05-12 DIAGNOSIS — J029 Acute pharyngitis, unspecified: Secondary | ICD-10-CM

## 2013-05-12 LAB — POCT RAPID STREP A (OFFICE): Rapid Strep A Screen: NEGATIVE

## 2013-05-12 NOTE — Patient Instructions (Addendum)
Rest, fluids , tylenol  Call if no better in few days Call anytime if the symptoms are severe -- [place], tome muchos liquidos y tylenol para el dolor de garganta si no esta mejor en unos dias o si se pone peor: llame a la clinica

## 2013-05-12 NOTE — Progress Notes (Signed)
  Subjective:    Patient ID: Amy Davidson, female    DOB: 1971/01/30, 42 y.o.   MRN: [id number]  HPI Acute visit Sore throat for the last several days, the first 2 days the pain was intense and she had fever. This morning the pain has decrease, no further fevers. Her son was sick with what seemed to be an upper respiratory infection , he is already getting better. She also mentioned her R face "feels funny" but denies pain per se, see review of systems.  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 No chest congestion. No nausea vomiting. Mild headache. Denies any visual disturbances.     Objective:   Physical Exam BP 114/78  Pulse 80  Temp(Src) 98.1 F (36.7 C) (Oral)  Wt 184 lb (83.462 kg)  BMI 32.6 kg/m2  SpO2 98% \ General -- alert, well-developed, NAD   Neck --FROM HEENT -- TMs normal, throat w/o redness, Uvula midline, pharynx walls symmetric, face symmetric and not tender to palpation,nose not congested   Lungs -- normal respiratory effort, no intercostal retractions, no accessory muscle use, and normal breath sounds.   Heart-- normal rate, regular rhythm, no murmur, and no gallop.    Neurologic-- alert & oriented X3; Speech, gait and motor are normal. Face symmetric. EOMI, pupils equal and reactive. Psych-- Cognition and judgment appear intact. Alert and cooperative with normal attention span and concentration.  not anxious appearing and not depressed appearing.        Assessment & Plan:  Sore throat, Strep test negative, will send a throat culture and treat if positive. She has some ill-defined face discomfort,  neurological exam is normal, No evidence of sinusitis on clinical grounds,she has no rash that I can tell. Recommend observation, see instructions

## 2013-05-13 ENCOUNTER — Encounter: Payer: Self-pay | Admitting: Internal Medicine

## 2013-05-14 ENCOUNTER — Encounter: Payer: Self-pay | Admitting: *Deleted

## 2013-05-14 LAB — CULTURE, GROUP A STREP: Organism ID, Bacteria: NORMAL

## 2014-02-17 ENCOUNTER — Telehealth: Payer: Self-pay

## 2014-02-17 NOTE — Telephone Encounter (Signed)
Patient confirmed--language barrier--unable to obtain pre visit information Tdap--05/2011

## 2014-02-18 ENCOUNTER — Encounter: Payer: Self-pay | Admitting: Internal Medicine

## 2014-02-18 ENCOUNTER — Ambulatory Visit (INDEPENDENT_AMBULATORY_CARE_PROVIDER_SITE_OTHER): Payer: BC Managed Care – PPO | Admitting: Internal Medicine

## 2014-02-18 VITALS — BP 111/69 | HR 69 | Temp 98.2°F | Ht 63.1 in | Wt 186.0 lb

## 2014-02-18 DIAGNOSIS — Z0000 Encounter for general adult medical examination without abnormal findings: Secondary | ICD-10-CM

## 2014-02-18 LAB — LIPID PANEL
Cholesterol: 206 mg/dL — ABNORMAL HIGH (ref 0–200)
HDL: 42 mg/dL (ref >39.00)
LDL Cholesterol: 144 mg/dL — ABNORMAL HIGH (ref 0–99)
Total CHOL/HDL Ratio: 5
Triglycerides: 102 mg/dL (ref 0.0–149.0)
VLDL: 20.4 mg/dL (ref 0.0–40.0)

## 2014-02-18 LAB — CBC WITH DIFFERENTIAL/PLATELET
Basophils Absolute: 0 10*3/uL (ref 0.0–0.1)
Basophils Relative: 0.7 % (ref 0.0–3.0)
Eosinophils Absolute: 0.2 10*3/uL (ref 0.0–0.7)
Eosinophils Relative: 2.9 % (ref 0.0–5.0)
HCT: 36.4 % (ref 36.0–46.0)
Hemoglobin: 12 g/dL (ref 12.0–15.0)
Lymphocytes Relative: 34.3 % (ref 12.0–46.0)
Lymphs Abs: 1.8 10*3/uL (ref 0.7–4.0)
MCHC: 32.9 g/dL (ref 30.0–36.0)
MCV: 87.8 fl (ref 78.0–100.0)
Monocytes Absolute: 0.3 10*3/uL (ref 0.1–1.0)
Monocytes Relative: 5.7 % (ref 3.0–12.0)
Neutro Abs: 3 10*3/uL (ref 1.4–7.7)
Neutrophils Relative %: 56.4 % (ref 43.0–77.0)
Platelets: 184 10*3/uL (ref 150.0–400.0)
RBC: 4.14 Mil/uL (ref 3.87–5.11)
RDW: 14.8 % — ABNORMAL HIGH (ref 11.5–14.6)
WBC: 5.4 10*3/uL (ref 4.5–10.5)

## 2014-02-18 LAB — COMPREHENSIVE METABOLIC PANEL
ALT: 13 U/L (ref 0–35)
AST: 17 U/L (ref 0–37)
Albumin: 4.2 g/dL (ref 3.5–5.2)
Alkaline Phosphatase: 58 U/L (ref 39–117)
BUN: 14 mg/dL (ref 6–23)
CO2: 27 mEq/L (ref 19–32)
Calcium: 8.8 mg/dL (ref 8.4–10.5)
Chloride: 104 mEq/L (ref 96–112)
Creatinine, Ser: 0.7 mg/dL (ref 0.4–1.2)
GFR: 95.41 mL/min (ref >60.00)
Glucose, Bld: 83 mg/dL (ref 70–99)
Potassium: 3.7 mEq/L (ref 3.5–5.1)
Sodium: 138 mEq/L (ref 135–145)
Total Bilirubin: 0.4 mg/dL (ref 0.3–1.2)
Total Protein: 6.8 g/dL (ref 6.0–8.3)

## 2014-02-18 LAB — TSH: TSH: 1.63 u[IU]/mL (ref 0.35–5.50)

## 2014-02-18 NOTE — Assessment & Plan Note (Signed)
Td 2012  Last gyn ~ 02-2013, Dr Senaida Oresichardson, has an appointment for f/u already Labs Diet-exercise discussed  RTC 1 year

## 2014-02-18 NOTE — Progress Notes (Signed)
   Subjective:    Patient ID: Amy Davidson, female    DOB: 1971-02-19, 43 y.o.   MRN: [id number]  DOS:  02/18/2014 Type of  visit: CPX   ROS Diet-- healthy Exercise-- active, no routine exercise  No  CP, SOB No palpitations, no lower extremity edema Denies  nausea, vomiting diarrhea Denies  blood in the stools (-) cough, sputum production (-) wheezing, chest congestion No dysuria, gross hematuria, difficulty urinating  No anxiety, depression    Past Medical History  Diagnosis Date  . Gestational diabetes     with first preg only  . Contraception     none    Past Surgical History  Procedure Laterality Date  . Cesarean section  06/23/2011    Procedure: CESAREAN SECTION;  Surgeon: Leighton Roachodd D Meisinger;  Location: [facility];  Service: Gynecology;  Laterality: N/A;  Primary cesarean section with delivery of baby boy at [id number]. Apgars 8/9.    History   Social History  . Marital Status: Married    Spouse Name: N/A    Number of Children: 5  . Years of Education: N/A   Occupational History  . stay home    Social History Main Topics  . Smoking status: Never Smoker   . Smokeless tobacco: Never Used  . Alcohol Use: No  . Drug Use: No  . Sexual Activity:    Other Topics Concern  . Not on file   Social History Narrative   Married, lives w/ husband and 5 children, from [place]     Family History: Hypertension-- M DM--no CAD-- no Stroke-- no breast ca--cousin   colon ca--no        Medication List    Notice As of 02/18/2014  5:14 PM   You have not been prescribed any medications.         Objective:   Physical Exam BP 111/69  Pulse 69  Temp(Src) 98.2 F (36.8 C)  Ht 5' 3.1" (1.603 m)  Wt 186 lb (84.369 kg)  BMI 32.83 kg/m2  SpO2 98%  General -- alert, well-developed, NAD.  Neck --no thyromegaly , normal carotid pulse  HEENT-- Not pale.  Lungs -- normal respiratory effort, no intercostal retractions, no accessory muscle use, and normal breath  sounds.  Heart-- normal rate, regular rhythm, no murmur.  Abdomen-- Not distended, good bowel sounds,soft, non-tender.  Extremities-- no pretibial edema bilaterally  Neurologic--  alert & oriented X3. Speech normal, gait normal, strength normal in all extremities.  Psych-- Cognition and judgment appear intact. Cooperative with normal attention span and concentration. No anxious or depressed appearing.      Assessment & Plan:     \

## 2014-02-18 NOTE — Patient Instructions (Signed)
Get your blood work before you leave   Next visit is for a physical exam in 1 year, fasting Please make an appointment    

## 2014-02-18 NOTE — Progress Notes (Signed)
Pre visit review using our clinic review tool, if applicable. No additional management support is needed unless otherwise documented below in the visit note. 

## 2014-02-21 ENCOUNTER — Encounter: Payer: Self-pay | Admitting: *Deleted

## 2014-05-18 ENCOUNTER — Other Ambulatory Visit: Payer: Self-pay | Admitting: Obstetrics and Gynecology

## 2014-05-18 DIAGNOSIS — R928 Other abnormal and inconclusive findings on diagnostic imaging of breast: Secondary | ICD-10-CM

## 2014-05-20 ENCOUNTER — Ambulatory Visit: Payer: BC Managed Care – PPO | Admitting: Internal Medicine

## 2014-05-25 LAB — HM PAP SMEAR

## 2014-05-30 ENCOUNTER — Ambulatory Visit
Admission: RE | Admit: 2014-05-30 | Discharge: 2014-05-30 | Disposition: A | Discharge status: Home / Self Care | Payer: BC Managed Care – PPO | Source: Ambulatory Visit | Attending: Obstetrics and Gynecology | Admitting: Obstetrics and Gynecology

## 2014-05-30 DIAGNOSIS — R928 Other abnormal and inconclusive findings on diagnostic imaging of breast: Secondary | ICD-10-CM

## 2014-09-26 ENCOUNTER — Encounter: Payer: Self-pay | Admitting: Internal Medicine

## 2014-12-07 ENCOUNTER — Ambulatory Visit (INDEPENDENT_AMBULATORY_CARE_PROVIDER_SITE_OTHER): Payer: BLUE CROSS/BLUE SHIELD | Admitting: Emergency Medicine

## 2014-12-07 VITALS — BP 118/72 | HR 109 | Temp 101.4°F | Resp 18 | Ht 62.5 in | Wt 190.0 lb

## 2014-12-07 DIAGNOSIS — R509 Fever, unspecified: Secondary | ICD-10-CM

## 2014-12-07 DIAGNOSIS — J209 Acute bronchitis, unspecified: Secondary | ICD-10-CM

## 2014-12-07 MED ORDER — HYDROCOD POLST-CHLORPHEN POLST 10-8 MG/5ML PO LQCR: 5.0000 mL | Freq: Two times a day (BID) | ORAL | Status: DC | PRN

## 2014-12-07 MED ORDER — AMOXICILLIN 500 MG PO CAPS: 500.0000 mg | ORAL_CAPSULE | Freq: Three times a day (TID) | ORAL | Status: DC

## 2014-12-07 MED ORDER — IBUPROFEN 200 MG PO TABS
400.0000 mg | ORAL_TABLET | Freq: Once | ORAL | Status: AC
  Administered 2014-12-07: 400 mg via ORAL

## 2014-12-07 NOTE — Patient Instructions (Signed)
Bronquitis aguda °(Acute Bronchitis) °La bronquitis es una inflamación de las vías respiratorias que se extienden desde la tráquea hasta los pulmones (bronquios). La inflamación produce la formación de mucosidad. Esto produce tos, que es el síntoma más frecuente de la bronquitis.  °Cuando la bronquitis es aguda, generalmente comienza de manera súbita y desaparece luego de un par de semanas. El hábito de fumar, las alergias y el asma pueden empeorar la bronquitis. Los episodios repetidos de bronquitis pueden causar más problemas pulmonares.  °CAUSAS °La causa más frecuente de bronquitis aguda es el mismo virus que produce el resfrío. El virus puede propagarse de una persona a la otra (contagioso) a través de la tos y los estornudos, y al tocar objetos contaminados. °SIGNOS Y SÍNTOMAS  °· Tos. °· Fiebre. °· Tos con mucosidad. °· Dolores en el cuerpo. °· Congestión en el pecho. °· Escalofríos. °· Falta de aire. °· Dolor de garganta. °DIAGNÓSTICO  °La bronquitis aguda en general se diagnostica con un examen físico. El médico también le hará preguntas sobre su historia clínica. En algunos casos se indican otros estudios, como radiografías, para descartar otras enfermedades.  °TRATAMIENTO  °La bronquitis aguda generalmente desaparece en un par de semanas. Con frecuencia, no es necesario realizar un tratamiento. Los medicamentos se indican para aliviar la fiebre o la tos. Generalmente, no es necesario el uso de antibióticos, pero pueden recetarse en ciertas ocasiones. En algunos casos, se recomienda el uso de un inhalador para mejorar la falta de aire y controlar la tos. Un vaporizador de aire frío podrá ayudarlo a disolver las secreciones bronquiales y facilitar su eliminación.  °INSTRUCCIONES PARA EL CUIDADO EN EL HOGAR  °· Descanse lo suficiente. °· Beba líquidos en abundancia para mantener la orina de color claro o amarillo pálido (excepto que padezca una enfermedad que requiera la restricción de líquidos). El aumento  de líquidos puede ayudar a que las secreciones respiratorias (esputo) sean menos espesas y a reducir la congestión del pecho, y evitará la deshidratación. °· Tome los medicamentos solamente como se lo haya indicado el médico. °· Si le recetaron antibióticos, asegúrese de terminarlos, incluso si comienza a sentirse mejor. °· Evite fumar o aspirar el humo de otros fumadores. La exposición al humo del cigarrillo o a irritantes químicos hará que la bronquitis empeore. Si fuma, considere el uso de goma de mascar o la aplicación de parches en la piel que contengan nicotina para aliviar los síntomas de abstinencia. Si deja de fumar, sus pulmones se curarán más rápido. °· Reduzca la probabilidad de otro episodio de bronquitis aguda lavando sus manos con frecuencia, evitando a las personas que tengan síntomas y tratando de no tocarse las manos con la boca, la nariz o los ojos. °· Concurra a todas las visitas de control como se lo haya indicado el médico. °SOLICITE ATENCIÓN MÉDICA SI: °Los síntomas no mejoran después de una semana de tratamiento.  °SOLICITE ATENCIÓN MÉDICA DE INMEDIATO SI: °· Comienza a tener fiebre o escalofríos cada vez más intensos. °· Siente dolor en el pecho. °· Le falta el aire de manera preocupante. °· La flema tiene sangre. °· Se deshidrata. °· Se desmaya o siente que va a desmayarse de forma repetida. °· Tiene vómitos que se repiten. °· Tiene un dolor de cabeza intenso. °ASEGÚRESE DE QUE:  °· Comprende estas instrucciones. °· Controlará su afección. °· Recibirá ayuda de inmediato si no mejora o si empeora. °Document Released: 11/11/2005 Document Revised: 03/28/2014 °Exit[employer]® Patient Information ©2015 [employer]. This information is not intended to replace   advice given to you by your health care provider. Make sure you discuss any questions you have with your health care provider. ° °

## 2014-12-07 NOTE — Progress Notes (Signed)
[facility] [address], [place] [id number] [phone]  Date:  12/07/2014   Name:  Amy Davidson   DOB:  Oct 25, 1971   MRN:  [id number]  PCP:  Willow OraJose Paz, MD    Chief Complaint: Fever; Headache; and Sore Throat   History of Present Illness:  Amy Davidson is a 44 y.o. very pleasant female patient who presents with the following:  Ill past three days with cough and sore throat and fever  Scant purulent sputum production.  No coryza No nausea or vomiting No stool change No rash No improvement with over the counter medications or other home remedies. Denies other complaint or health concern today.   Patient Active Problem List   Diagnosis Date Noted  . Annual physical exam 02/18/2014  . Trapezius strain 10/16/2012  . Palpitation 05/04/2012    Past Medical History  Diagnosis Date  . Contraception     none    Past Surgical History  Procedure Laterality Date  . Cesarean section  06/23/2011    Procedure: CESAREAN SECTION;  Surgeon: Leighton Roachodd D Meisinger;  Location: [facility];  Service: Gynecology;  Laterality: N/A;  Primary cesarean section with delivery of baby boy at [id number]. Apgars 8/9.    History  Substance Use Topics  . Smoking status: Never Smoker   . Smokeless tobacco: Never Used  . Alcohol Use: No    Family History  Problem Relation Age of Onset  . Diabetes Mother     No Known Allergies  Medication list has been reviewed and updated.  No current outpatient prescriptions on file prior to visit.   No current facility-administered medications on file prior to visit.    Review of Systems:  As per HPI, otherwise negative.    Physical Examination: Filed Vitals:   12/07/14 1548  BP: 118/72  Pulse: 109  Temp: 101.4 F (38.6 C)  Resp: 18   Filed Vitals:   12/07/14 1548  Height: 5' 2.5" (1.588 m)  Weight: 190 lb (86.183 kg)   Body mass index is 34.18 kg/(m^2). Ideal Body Weight: Weight in (lb) to have BMI = 25: 138.6  GEN:  WDWN, NAD, Non-toxic, A & O x 3 HEENT: Atraumatic, Normocephalic. Neck supple. No masses, No LAD. Ears and Nose: No external deformity. CV: RRR, No M/G/R. No JVD. No thrill. No extra heart sounds. PULM: CTA B, no wheezes, crackles, rhonchi. No retractions. No resp. distress. No accessory muscle use. ABD: S, NT, ND, +BS. No rebound. No HSM. EXTR: No c/c/e NEURO Normal gait.  PSYCH: Normally interactive. Conversant. Not depressed or anxious appearing.  Calm demeanor.    Assessment and Plan: Bronchitis Amoxicillin tussionex  Signed,  Phillips OdorJeffery Anderson, MD

## 2014-12-09 ENCOUNTER — Encounter: Payer: Self-pay | Admitting: Family Medicine

## 2014-12-09 ENCOUNTER — Ambulatory Visit (HOSPITAL_BASED_OUTPATIENT_CLINIC_OR_DEPARTMENT_OTHER)
Admission: RE | Admit: 2014-12-09 | Discharge: 2014-12-09 | Disposition: A | Discharge status: Home / Self Care | Payer: BLUE CROSS/BLUE SHIELD | Source: Ambulatory Visit | Attending: Family Medicine | Admitting: Family Medicine

## 2014-12-09 ENCOUNTER — Ambulatory Visit (INDEPENDENT_AMBULATORY_CARE_PROVIDER_SITE_OTHER): Payer: BLUE CROSS/BLUE SHIELD | Admitting: Family Medicine

## 2014-12-09 VITALS — BP 132/82 | HR 123 | Temp 102.8°F | Resp 17 | Wt 190.2 lb

## 2014-12-09 DIAGNOSIS — R509 Fever, unspecified: Secondary | ICD-10-CM

## 2014-12-09 DIAGNOSIS — J189 Pneumonia, unspecified organism: Secondary | ICD-10-CM | POA: Insufficient documentation

## 2014-12-09 DIAGNOSIS — J029 Acute pharyngitis, unspecified: Secondary | ICD-10-CM

## 2014-12-09 DIAGNOSIS — R6889 Other general symptoms and signs: Secondary | ICD-10-CM

## 2014-12-09 LAB — POCT URINALYSIS DIPSTICK
Bilirubin, UA: NEGATIVE
Glucose, UA: NEGATIVE
Leukocytes, UA: NEGATIVE
Nitrite, UA: NEGATIVE
Spec Grav, UA: 1.015
Urobilinogen, UA: 0.2
pH, UA: 8

## 2014-12-09 LAB — HEPATIC FUNCTION PANEL
ALT: 16 U/L (ref 0–35)
AST: 21 U/L (ref 0–37)
Albumin: 3.9 g/dL (ref 3.5–5.2)
Alkaline Phosphatase: 55 U/L (ref 39–117)
Bilirubin, Direct: 0.1 mg/dL (ref 0.0–0.3)
Indirect Bilirubin: 0.3 mg/dL (ref 0.2–1.2)
Total Bilirubin: 0.4 mg/dL (ref 0.2–1.2)
Total Protein: 6.9 g/dL (ref 6.0–8.3)

## 2014-12-09 LAB — BASIC METABOLIC PANEL
BUN: 11 mg/dL (ref 6–23)
CO2: 24 mEq/L (ref 19–32)
Calcium: 8.4 mg/dL (ref 8.4–10.5)
Chloride: 93 mEq/L — ABNORMAL LOW (ref 96–112)
Creat: 0.7 mg/dL (ref 0.50–1.10)
Glucose, Bld: 103 mg/dL — ABNORMAL HIGH (ref 70–99)
Potassium: 4 mEq/L (ref 3.5–5.3)
Sodium: 130 mEq/L — ABNORMAL LOW (ref 135–145)

## 2014-12-09 LAB — CBC WITH DIFFERENTIAL/PLATELET
Basophils Absolute: 0 10*3/uL (ref 0.0–0.1)
Basophils Relative: 0 % (ref 0–1)
Eosinophils Absolute: 0 10*3/uL (ref 0.0–0.7)
Eosinophils Relative: 0 % (ref 0–5)
HCT: 35.6 % — ABNORMAL LOW (ref 36.0–46.0)
Hemoglobin: 11.9 g/dL — ABNORMAL LOW (ref 12.0–15.0)
Lymphocytes Relative: 15 % (ref 12–46)
Lymphs Abs: 1.1 10*3/uL (ref 0.7–4.0)
MCH: 28.3 pg (ref 26.0–34.0)
MCHC: 33.4 g/dL (ref 30.0–36.0)
MCV: 84.6 fL (ref 78.0–100.0)
MPV: 12 fL (ref 8.6–12.4)
Monocytes Absolute: 0.6 10*3/uL (ref 0.1–1.0)
Monocytes Relative: 8 % (ref 3–12)
Neutro Abs: 5.6 10*3/uL (ref 1.7–7.7)
Neutrophils Relative %: 77 % (ref 43–77)
Platelets: 210 10*3/uL (ref 150–400)
RBC: 4.21 MIL/uL (ref 3.87–5.11)
RDW: 14.2 % (ref 11.5–15.5)
WBC: 7.3 10*3/uL (ref 4.0–10.5)

## 2014-12-09 MED ORDER — AZITHROMYCIN 250 MG PO TABS: ORAL_TABLET | ORAL | Status: DC

## 2014-12-09 NOTE — Patient Instructions (Signed)
Please go to the lab to get your blood work and urine Go downstairs to get your chest xray before you leave STOP the Amoxicillin START the Azithromycin Drink plenty of fluids Alternate tylenol and ibuprofen every 4 hrs for headache and fever REST! Call with any questions or concerns Hang in there!!!

## 2014-12-09 NOTE — Progress Notes (Signed)
Pre visit review using our clinic review tool, if applicable. No additional management support is needed unless otherwise documented below in the visit note. 

## 2014-12-09 NOTE — Progress Notes (Signed)
   Subjective:    Patient ID: Amy Davidson, female    DOB: 20-Jul-1971, 44 y.o.   MRN: [id number]  HPI Fever- sxs started Sunday, went to [facility] 2 days ago and started on Amoxicillin.  Pt reports feeling worse than previous.  + HA, fevers and chills.  + sore throat.  No ear pain.  No tooth pain.  No N/V/D.  + anorexia.  No body aches.  + sick contacts- son.  + cough- productive.  Denies nasal congestion.  Denies urinary symptoms.   Review of Systems For ROS see HPI     Objective:   Physical Exam  Constitutional: She appears well-developed and well-nourished. No distress.  Obviously not feeling well  HENT:  Head: Normocephalic and atraumatic.  Right Ear: Tympanic membrane normal.  Left Ear: Tympanic membrane normal.  Nose: Mucosal edema and rhinorrhea present. Right sinus exhibits no maxillary sinus tenderness and no frontal sinus tenderness. Left sinus exhibits no maxillary sinus tenderness and no frontal sinus tenderness.  Mouth/Throat: Uvula is midline and mucous membranes are normal. Posterior oropharyngeal erythema present. No oropharyngeal exudate.  Eyes: Conjunctivae and EOM are normal. Pupils are equal, round, and reactive to light.  Neck: Normal range of motion. Neck supple.  Cardiovascular: Normal rate, regular rhythm and normal heart sounds.   Pulmonary/Chest: Effort normal and breath sounds normal. No respiratory distress. She has no wheezes.  + dry cough  Abdominal: Soft. Bowel sounds are normal. She exhibits no distension. There is no tenderness. There is no rebound.  Lymphadenopathy:    She has no cervical adenopathy.  Skin: Skin is warm.  Vitals reviewed.         Assessment & Plan:

## 2014-12-12 ENCOUNTER — Telehealth: Payer: Self-pay | Admitting: *Deleted

## 2014-12-12 LAB — EPSTEIN-BARR VIRUS VCA ANTIBODY PANEL
EBV EA IgG: <5 U/mL (ref <9.0)
EBV NA IgG: 266 U/mL — ABNORMAL HIGH (ref <18.0)
EBV VCA IgG: 58.1 U/mL — ABNORMAL HIGH (ref <18.0)
EBV VCA IgM: <10 U/mL (ref <36.0)

## 2014-12-12 NOTE — Telephone Encounter (Signed)
[facility] states that urine culture was contaminated . Will need to recollect . I contacted pt . Pt to come back to provide sample.

## 2014-12-12 NOTE — Assessment & Plan Note (Signed)
Pt clearly not feeling well and worsening despite 48 hrs of abx.  PE is unremarkable and doesn't provide information for possible source of fever as there are no specific findings or complaints.  Will do complete work up including CXR, UA, CBC w/ diff, mono panel.  Will switch from Amox to Azithro to cover possible atypical organisms.  Strep and flu tests negative.  Reviewed supportive care and red flags that should prompt return.  Pt expressed understanding and is in agreement w/ plan.

## 2014-12-12 NOTE — Telephone Encounter (Signed)
Pt already gave sample, before I could reach anyone.

## 2014-12-12 NOTE — Addendum Note (Signed)
Addended by: Eustace QuailEABOLD, Yalonda Sample J on: 12/12/2014 11:29 AM   Modules accepted: Orders

## 2014-12-12 NOTE — Telephone Encounter (Signed)
FYI

## 2014-12-12 NOTE — Telephone Encounter (Signed)
She doesn't have to come back b/c she has PNA and this is likely the cause of her fever and feeling poorly.  Please just check on how she's feeling.

## 2014-12-13 ENCOUNTER — Encounter: Payer: Self-pay | Admitting: General Practice

## 2014-12-13 LAB — URINE CULTURE
Colony Count: NO GROWTH
Organism ID, Bacteria: NO GROWTH

## 2014-12-15 ENCOUNTER — Encounter: Payer: Self-pay | Admitting: Internal Medicine

## 2014-12-15 ENCOUNTER — Ambulatory Visit (INDEPENDENT_AMBULATORY_CARE_PROVIDER_SITE_OTHER): Payer: BLUE CROSS/BLUE SHIELD | Admitting: Internal Medicine

## 2014-12-15 VITALS — BP 124/68 | HR 87 | Temp 98.1°F | Wt 192.4 lb

## 2014-12-15 DIAGNOSIS — J189 Pneumonia, unspecified organism: Secondary | ICD-10-CM

## 2014-12-15 DIAGNOSIS — N912 Amenorrhea, unspecified: Secondary | ICD-10-CM

## 2014-12-15 LAB — BASIC METABOLIC PANEL
BUN: 10 mg/dL (ref 6–23)
CO2: 29 mEq/L (ref 19–32)
Calcium: 9.4 mg/dL (ref 8.4–10.5)
Chloride: 102 mEq/L (ref 96–112)
Creatinine, Ser: 0.65 mg/dL (ref 0.40–1.20)
GFR: 105.24 mL/min (ref >60.00)
Glucose, Bld: 111 mg/dL — ABNORMAL HIGH (ref 70–99)
Potassium: 3.8 mEq/L (ref 3.5–5.1)
Sodium: 138 mEq/L (ref 135–145)

## 2014-12-15 LAB — POCT URINE PREGNANCY: Preg Test, Ur: NEGATIVE

## 2014-12-15 MED ORDER — LEVOFLOXACIN 500 MG PO TABS: 500.0000 mg | ORAL_TABLET | Freq: Every day | ORAL | Status: DC

## 2014-12-15 MED ORDER — HYDROCOD POLST-CHLORPHEN POLST 10-8 MG/5ML PO LQCR: 5.0000 mL | Freq: Every evening | ORAL | Status: DC | PRN

## 2014-12-15 NOTE — Progress Notes (Signed)
   Subjective:    Patient ID: Amy Davidson, female    DOB: [birth year], 44 y.o.   MRN: [id number]  DOS:  12/15/2014 Type of visit - description : f/u Interval history: Symptoms started 12/04/2014, went to another clinic, diagnosed with bronchitis, started amoxicillin. Came back to this office on 12/09/2014, she was doing worse. She had headache, cough, sore throat. Urine culture and CBC were unremarkable, sodium was 130, LFTs normal, chest x-ray show a right lower lobe pneumonia. She was prescribed a Z-Pak, here for follow-up  ROS Overall doing better. Still coughing sputum, change in color from yellow to white. Denies hemoptysis No dyspnea on exertion, she does have some chest pain on the right anterior chest. Denies fevers.   Past Medical History  Diagnosis Date  . Contraception     none    Past Surgical History  Procedure Laterality Date  . Cesarean section  06/23/2011    Procedure: CESAREAN SECTION;  Surgeon: Leighton Roachodd D Meisinger;  Location: [facility];  Service: Gynecology;  Laterality: N/A;  Primary cesarean section with delivery of baby boy at [id number]. Apgars 8/9.    History   Social History  . Marital Status: Married    Spouse Name: N/A    Number of Children: 5  . Years of Education: N/A   Occupational History  . stay home    Social History Main Topics  . Smoking status: Never Smoker   . Smokeless tobacco: Never Used  . Alcohol Use: No  . Drug Use: No  . Sexual Activity: Not on file   Other Topics Concern  . Not on file   Social History Narrative   Married, lives w/ husband and 5 children, from [place]        Medication List       This list is accurate as of: 12/15/14 11:59 PM.  Always use your most recent med list.               chlorpheniramine-HYDROcodone 10-8 MG/5ML Lqcr  Commonly known as:  Sandria SenterUSSIONEX PENNKINETIC ER  Take 5 mLs by mouth at bedtime as needed.     levofloxacin 500 MG tablet  Commonly known as:  LEVAQUIN  Take 1 tablet (500 mg  total) by mouth daily.           Objective:   Physical Exam BP 124/68 mmHg  Pulse 87  Temp(Src) 98.1 F (36.7 C) (Oral)  Wt 192 lb 6 oz (87.261 kg)  SpO2 96%  LMP 11/29/2014  General -- alert, well-developed, NAD.  Neck --  no LAD HEENT-- Not pale.  Lungs -- normal respiratory effort, no intercostal retractions, no accessory muscle use, + crackles at the right base with decreased breath sounds otherwise lungs are clear.  Heart-- normal rate, regular rhythm, no murmur.  Extremities-- no pretibial edema bilaterally  Neurologic--  alert & oriented X3. Speech normal, gait appropriate for age, strength symmetric and appropriate for age.  Psych-- Cognition and judgment appear intact. Cooperative with normal attention span and concentration. No anxious or depressed appearing.       Assessment & Plan:

## 2014-12-15 NOTE — Progress Notes (Signed)
Pre visit review using our clinic review tool, if applicable. No additional management support is needed unless otherwise documented below in the visit note. 

## 2014-12-15 NOTE — Patient Instructions (Signed)
Get your blood work before you leave    get the XR in one week    SAQUESE LA SANGRE ANTES DE IRSE  TOME LEVAQUIN UNA TABLETA CADA DIA POR UNA SEMANA  MUCINEX DM 1 TABLETA TODOS LOS DIAS HASTA QUE MEJORE SI LA TOS ES FUERTE DE NOCHE TOME UNA CUCHARADITA DE TUSSIONEX  EN UNA SEMANA REGRESE A TOMARSE UNA RADIOGRAFIA EN EL PRIMER PISO  SI NO ESTA MUCHO MEJOR EN 10 DIAS ---- LLAME A LA CLINICA

## 2014-12-16 NOTE — Assessment & Plan Note (Signed)
The patient has right lower lobe pneumonia, workup show decreased sodium, likely pnm d/t Legionella. She is feeling better however she still have decreased breath sounds and crackles on exam, probably has a  consolidation. She finished a Z-Pak Plan: Mucinex, Levaquin, BMP. Patient is not on birth control, will check a UPT before antibiotics Chest x-ray next week If not completely well she is advised to come back in 10 days

## 2014-12-22 ENCOUNTER — Ambulatory Visit (HOSPITAL_BASED_OUTPATIENT_CLINIC_OR_DEPARTMENT_OTHER)
Admission: RE | Admit: 2014-12-22 | Discharge: 2014-12-22 | Disposition: A | Discharge status: Home / Self Care | Payer: BLUE CROSS/BLUE SHIELD | Source: Ambulatory Visit | Attending: Internal Medicine | Admitting: Internal Medicine

## 2014-12-22 DIAGNOSIS — J189 Pneumonia, unspecified organism: Secondary | ICD-10-CM | POA: Diagnosis not present

## 2014-12-22 DIAGNOSIS — R05 Cough: Secondary | ICD-10-CM | POA: Insufficient documentation

## 2015-03-01 ENCOUNTER — Other Ambulatory Visit: Payer: Self-pay

## 2015-06-05 ENCOUNTER — Telehealth: Payer: Self-pay | Admitting: Internal Medicine

## 2015-06-05 NOTE — Telephone Encounter (Signed)
pre visit letter mailed 06/05/15 °

## 2015-06-22 ENCOUNTER — Telehealth: Payer: Self-pay | Admitting: Behavioral Health

## 2015-06-22 NOTE — Telephone Encounter (Signed)
Unable to reach patient at time of Pre-Visit Call.  Left message for patient to return call when available.    

## 2015-06-23 ENCOUNTER — Encounter: Payer: Self-pay | Admitting: Behavioral Health

## 2015-06-23 NOTE — Telephone Encounter (Signed)
Pre-Visit Call completed with patient and chart updated.   Pre-Visit Info documented in Specialty Comments under Snap[employer]ot.    

## 2015-06-26 ENCOUNTER — Encounter: Payer: Self-pay | Admitting: Internal Medicine

## 2015-06-26 ENCOUNTER — Ambulatory Visit (INDEPENDENT_AMBULATORY_CARE_PROVIDER_SITE_OTHER): Payer: BLUE CROSS/BLUE SHIELD | Admitting: Internal Medicine

## 2015-06-26 VITALS — BP 118/74 | HR 66 | Temp 98.6°F | Ht 63.0 in | Wt 197.1 lb

## 2015-06-26 DIAGNOSIS — Z0000 Encounter for general adult medical examination without abnormal findings: Secondary | ICD-10-CM | POA: Diagnosis not present

## 2015-06-26 DIAGNOSIS — J189 Pneumonia, unspecified organism: Secondary | ICD-10-CM

## 2015-06-26 LAB — CBC WITH DIFFERENTIAL/PLATELET
Basophils Absolute: 0 10*3/uL (ref 0.0–0.1)
Basophils Relative: 0.7 % (ref 0.0–3.0)
Eosinophils Absolute: 0.2 10*3/uL (ref 0.0–0.7)
Eosinophils Relative: 2.4 % (ref 0.0–5.0)
HCT: 39.1 % (ref 36.0–46.0)
Hemoglobin: 12.9 g/dL (ref 12.0–15.0)
Lymphocytes Relative: 40.6 % (ref 12.0–46.0)
Lymphs Abs: 2.5 10*3/uL (ref 0.7–4.0)
MCHC: 33 g/dL (ref 30.0–36.0)
MCV: 87.7 fl (ref 78.0–100.0)
Monocytes Absolute: 0.4 10*3/uL (ref 0.1–1.0)
Monocytes Relative: 5.7 % (ref 3.0–12.0)
Neutro Abs: 3.2 10*3/uL (ref 1.4–7.7)
Neutrophils Relative %: 50.6 % (ref 43.0–77.0)
Platelets: 211 10*3/uL (ref 150.0–400.0)
RBC: 4.45 Mil/uL (ref 3.87–5.11)
RDW: 14.3 % (ref 11.5–15.5)
WBC: 6.3 10*3/uL (ref 4.0–10.5)

## 2015-06-26 LAB — LIPID PANEL
Cholesterol: 188 mg/dL (ref 0–200)
HDL: 41.2 mg/dL (ref >39.00)
LDL Cholesterol: 114 mg/dL — ABNORMAL HIGH (ref 0–99)
NonHDL: 146.59
Total CHOL/HDL Ratio: 5
Triglycerides: 165 mg/dL — ABNORMAL HIGH (ref 0.0–149.0)
VLDL: 33 mg/dL (ref 0.0–40.0)

## 2015-06-26 LAB — HEMOGLOBIN A1C: Hgb A1c MFr Bld: 5.9 % (ref 4.6–6.5)

## 2015-06-26 NOTE — Patient Instructions (Signed)
Get your blood work before you leave    DE UNA MUESTRA DE SANGRE ANTES DE IRESE REGRESE EN UN AN~O

## 2015-06-26 NOTE — Assessment & Plan Note (Addendum)
Td 2012  Last gyn 2015, has an appointment ot see  Dr Senaida Ores this month Labs: FLP, CBC, A1c Diet-exercise discussed   RTC 1 year Other issues: Occasional left lower quadrant abdominal pain without GI symptoms, recommend to discuss with gynecology.

## 2015-06-26 NOTE — Progress Notes (Signed)
Pre visit review using our clinic review tool, if applicable. No additional management support is needed unless otherwise documented below in the visit note. 

## 2015-06-26 NOTE — Assessment & Plan Note (Signed)
Currently asymptomatic and physical exam normal. No need for further x-rays.

## 2015-06-26 NOTE — Progress Notes (Signed)
Subjective:    Patient ID: Amy Davidson, female    DOB: 01/10/1971, 43 y.o.   MRN: [id number]  DOS:  06/26/2015 Type of visit - description : Here with her son, CPX Interval history: In general feeling well. No major concerns   Review of Systems   Constitutional: No fever. No chills. No unexplained wt changes. No unusual sweats  HEENT: No dental problems, no ear discharge, no facial swelling, no voice changes. No eye discharge, no eye  redness , no  intolerance to light   Respiratory: No wheezing , no  difficulty breathing. No cough , no mucus production  Cardiovascular: No CP, no leg swelling , no  Palpitations  GI:  From time to time has some mild left lower quadrant abdominal discomfort, last one or 2 days, no clear-cut relationship with her periods. no nausea, no vomiting, no diarrhea.  No blood in the stools. No dysphagia, no odynophagia    Endocrine: No polyphagia, no polyuria , no polydipsia  GU: No dysuria, gross hematuria, difficulty urinating. No urinary urgency, no frequency. Periods are regular, no unusual vaginal bleeding  Musculoskeletal: No joint swellings or unusual aches or pains  Skin: No change in the color of the skin, palor , no  Rash  Allergic, immunologic: No environmental allergies , no  food allergies  Neurological: No dizziness no  syncope. No headaches. No diplopia, no slurred, no slurred speech, no motor deficits, no facial  Numbness  Hematological: No enlarged lymph nodes, no easy bruising , no unusual bleedings  Psychiatry: No suicidal ideas, no hallucinations, no beavior problems, no confusion.  No unusual/severe anxiety, no depression   Past Medical History  Diagnosis Date  . Contraception     none    Past Surgical History  Procedure Laterality Date  . Cesarean section  06/23/2011    Procedure: CESAREAN SECTION;  Surgeon: Leighton Roach Meisinger;  Location: [facility];  Service: Gynecology;  Laterality: N/A;  Primary cesarean section with  delivery of baby boy at 71. Apgars 8/9.    History   Social History  . Marital Status: Married    Spouse Name: N/A  . Number of Children: 5  . Years of Education: N/A   Occupational History  . stay home    Social History Main Topics  . Smoking status: Never Smoker   . Smokeless tobacco: Never Used  . Alcohol Use: No  . Drug Use: No  . Sexual Activity: Not on file   Other Topics Concern  . Not on file   Social History Narrative   Married, lives w/ husband and 5 children, from [place]     Family History  Problem Relation Age of Onset  . Diabetes Mother   . CAD Neg Hx   . Colon cancer Neg Hx   . Breast cancer Neg Hx       Medication List    Notice  As of 06/26/2015 12:58 PM   You have not been prescribed any medications.         Objective:   Physical Exam BP 118/74 mmHg  Pulse 66  Temp(Src) 98.6 F (37 C) (Oral)  Ht  (1.6 m)  Wt 197 lb 2 oz (89.415 kg)  BMI 34.93 kg/m2  SpO2 99%  LMP 05/25/2015 (Approximate) General:   Well developed, well nourished . NAD.  HEENT:  Normocephalic . Face symmetric, atraumatic Neck: No thyromegaly Lungs:  CTA B Normal respiratory effort, no intercostal retractions, no accessory muscle use. Heart:  RRR,  no murmur.  no pretibial edema bilaterally  Abdomen:  Not distended, soft, non-tender. No rebound or rigidity. No mass,organomegaly Skin: Not pale. Not jaundice Neurologic:  alert & oriented X3.  Speech normal, gait appropriate for age and unassisted Psych--  Cognition and judgment appear intact.  Cooperative with normal attention span and concentration.  Behavior appropriate. No anxious or depressed appearing.    Assessment & Plan:

## 2015-09-01 ENCOUNTER — Encounter: Payer: Self-pay | Admitting: Internal Medicine

## 2015-09-01 ENCOUNTER — Ambulatory Visit (INDEPENDENT_AMBULATORY_CARE_PROVIDER_SITE_OTHER): Payer: BLUE CROSS/BLUE SHIELD | Admitting: Internal Medicine

## 2015-09-01 VITALS — BP 118/74 | HR 69 | Temp 98.2°F | Ht 63.0 in | Wt 194.0 lb

## 2015-09-01 DIAGNOSIS — M542 Cervicalgia: Secondary | ICD-10-CM | POA: Diagnosis not present

## 2015-09-01 MED ORDER — PREDNISONE 10 MG PO TABS: ORAL_TABLET | ORAL | Status: DC

## 2015-09-01 NOTE — Progress Notes (Signed)
Pre visit review using our clinic review tool, if applicable. No additional management support is needed unless otherwise documented below in the visit note. 

## 2015-09-01 NOTE — Progress Notes (Signed)
   Subjective:    Patient ID: Amy Davidson, female    DOB: [birth year], 44 y.o.   MRN: [id number]  DOS:  09/01/2015 Type of visit - description : Acute visit, here with interpreter Interval history: Symptoms started approximately 2 weeks ago, gradually: Pain at the neck, right side, with some radiation to the proximal right upper extremity. From time to time also the R arm feel slightly numb. Denies any injury, fall.    Review of Systems Denies fever chills or a rash No nausea vomiting No bladder or bowel incontinence No difficulty with her gait.   Past Medical History  Diagnosis Date  . Contraception     none    Past Surgical History  Procedure Laterality Date  . Cesarean section  06/23/2011    Procedure: CESAREAN SECTION;  Surgeon: Leighton Roach Meisinger;  Location: [facility];  Service: Gynecology;  Laterality: N/A;  Primary cesarean section with delivery of baby boy at 5. Apgars 8/9.    Social History   Social History  . Marital Status: Married    Spouse Name: N/A  . Number of Children: 5  . Years of Education: N/A   Occupational History  . stay home    Social History Main Topics  . Smoking status: Never Smoker   . Smokeless tobacco: Never Used  . Alcohol Use: No  . Drug Use: No  . Sexual Activity: Not on file   Other Topics Concern  . Not on file   Social History Narrative   Married, lives w/ husband and 5 children, from [place]        Medication List       This list is accurate as of: 09/01/15 11:59 PM.  Always use your most recent med list.               predniSONE 10 MG tablet  Commonly known as:  DELTASONE  5 tablets x 2 days , 4 tablets x 2 days, 3 tabs x 2 days, 2 tabs x 2 days, 1 tab x 2 days           Objective:   Physical Exam BP 118/74 mmHg  Pulse 69  Temp(Src) 98.2 F (36.8 C) (Oral)  Ht  (1.6 m)  Wt 194 lb (87.998 kg)  BMI 34.37 kg/m2  SpO2 99%  LMP 08/22/2015 (Exact Date) General:   Well developed, well nourished  . NAD.  HEENT:  Normocephalic . Face symmetric, atraumatic Neck: No TTP at the cervical spine, range of motion is normal but she did report pain when she turned to the right Skin: Not pale. Not jaundice Neurologic:  alert & oriented X3.  Speech normal, gait appropriate for age and unassisted. DTRs and muscle strength symmetric Psych--  Cognition and judgment appear intact.  Cooperative with normal attention span and concentration.  Behavior appropriate. No anxious or depressed appearing.      Assessment & Plan:   Neck pain: Neck pain with radiculopathy type of symptoms. Neurological exam is normal. Will try conservative treatment with prednisone and Tylenol. Clear instructions provided in Spanish. She knows to call me if she is not better or if the symptoms resurface. She does not take birth control pills, last menstrual period 08/22/2015, declined a UPT.

## 2015-09-01 NOTE — Patient Instructions (Signed)
TOME LA PREDNISONA TAL CUAL ESTA PRESCRITA  TOME TYLENOL DE 500 MG : 2 TABLETAS CADA 8 HORAS SI LO NECESITA (SOLO SI LE DUELE)  SI NO SE MEJORA O EL DOLOR LE REGRESA: POR FAVOR LLAMENOS PARA MANDARLA AL ESPECIALISTA

## 2015-09-13 ENCOUNTER — Encounter: Payer: Self-pay | Admitting: Internal Medicine

## 2015-09-13 ENCOUNTER — Telehealth: Payer: Self-pay

## 2015-09-13 ENCOUNTER — Ambulatory Visit (INDEPENDENT_AMBULATORY_CARE_PROVIDER_SITE_OTHER): Payer: BLUE CROSS/BLUE SHIELD | Admitting: Internal Medicine

## 2015-09-13 ENCOUNTER — Telehealth: Payer: Self-pay | Admitting: Internal Medicine

## 2015-09-13 VITALS — BP 108/74 | HR 71 | Temp 98.0°F | Ht 63.0 in | Wt 192.2 lb

## 2015-09-13 DIAGNOSIS — M5410 Radiculopathy, site unspecified: Secondary | ICD-10-CM

## 2015-09-13 DIAGNOSIS — M5412 Radiculopathy, cervical region: Secondary | ICD-10-CM | POA: Diagnosis not present

## 2015-09-13 DIAGNOSIS — Z09 Encounter for follow-up examination after completed treatment for conditions other than malignant neoplasm: Secondary | ICD-10-CM

## 2015-09-13 MED ORDER — HYDROCODONE-ACETAMINOPHEN 5-325 MG PO TABS
1.0000 | ORAL_TABLET | Freq: Three times a day (TID) | ORAL | Status: DC | PRN
Start: 2015-09-13 — End: 2016-09-02

## 2015-09-13 NOTE — Progress Notes (Signed)
Pre visit review using our clinic review tool, if applicable. No additional management support is needed unless otherwise documented below in the visit note. 

## 2015-09-13 NOTE — Telephone Encounter (Signed)
Seen today. 

## 2015-09-13 NOTE — Telephone Encounter (Signed)
-----   Message from [place] sent at 09/13/2015 10:36 AM EDT ----- Regarding: Acute appt Wannetta SenderHey Mariellen Blaney I had scheduled Nisha Goller for an acute today at 2:00 but pt stated that she saw Dr. Drue NovelPaz 2 wks and is still having pain issues, (her Right arm) does she have to come in at our office or do you have to give her a referral to a specialist?

## 2015-09-13 NOTE — Telephone Encounter (Signed)
Please advise 

## 2015-09-13 NOTE — Progress Notes (Signed)
   Subjective:    Patient ID: Amy Davidson, female    DOB: [birth year], 44 y.o.   MRN: [id number]  DOS:  09/13/2015 Type of visit - description : Follow-up, not better Interval history: Recently seen with neck pain, prednisone did not help, continue with constant neck pain now radiation not only to shoulder but also to the right elbow. Reports that her right arm is numb and sometimes + tingling. Tylenol not helping much.     Review of Systems Denies any fall or injury but she does repetitive motions above the head at work for many hours a day. No problems with her gait.   Past Medical History  Diagnosis Date  . Contraception     none    Past Surgical History  Procedure Laterality Date  . Cesarean section  06/23/2011    Procedure: CESAREAN SECTION;  Surgeon: Leighton Roachodd D Meisinger;  Location: [facility];  Service: Gynecology;  Laterality: N/A;  Primary cesarean section with delivery of baby boy at [id number]. Apgars 8/9.    Social History   Social History  . Marital Status: Married    Spouse Name: N/A  . Number of Children: 5  . Years of Education: N/A   Occupational History  . stay home    Social History Main Topics  . Smoking status: Never Smoker   . Smokeless tobacco: Never Used  . Alcohol Use: No  . Drug Use: No  . Sexual Activity: Not on file   Other Topics Concern  . Not on file   Social History Narrative   Married, lives w/ husband and 5 children, from [place]        Medication List       This list is accurate as of: 09/13/15  6:05 PM.  Always use your most recent med list.               HYDROcodone-acetaminophen 5-325 MG tablet  Commonly known as:  NORCO/VICODIN  Take 1-2 tablets by mouth every 8 (eight) hours as needed.           Objective:   Physical Exam BP 108/74 mmHg  Pulse 71  Temp(Src) 98 F (36.7 C) (Oral)  Ht 5\' 3"  (1.6 m)  Wt 192 lb 4 oz (87.204 kg)  BMI 34.06 kg/m2  SpO2 98%  LMP 08/22/2015 (Exact Date) General:   Well  developed, well nourished . NAD.  HEENT:  Normocephalic . Face symmetric, atraumatic Neck: No TTP at the cervical spine, range of motion: Decrease, unable to fully extend the neck.  MSK: Shoulders range of motion normal bilaterally Skin: Not pale. Not jaundice Neurologic:  alert & oriented X3.  Speech normal, gait appropriate for age and unassisted. Motor and DTRs symmetric Psych--  Cognition and judgment appear intact.  Cooperative with normal attention span and concentration.  Behavior appropriate. No anxious or depressed appearing.      Assessment & Plan:   Plan Neck pain : Ongoing neck pain with radiculopathy on the right arm, fortunately the neurological exam remains normal. Switch to hydrocodone Referral to ortho. Work excuse until Monday.

## 2015-09-13 NOTE — Patient Instructions (Signed)
   La vamos a mandar al Qwest Communicationsespecialista, Building control surveyorla deben llamar en los proximos dias  Tome la pastilla para el dolor que se llama hydrocodone. La pudede causar suen~o, quizas es mejor tomarla de noche solamente  Llame si se pone peor o no puede ver al especialista en [employer]. Robinson Worldwideuna semana

## 2015-09-13 NOTE — Assessment & Plan Note (Signed)
Neck pain : Ongoing neck pain with radiculopathy on the right arm, fortunately the neurological exam remains normal. Switch to hydrocodone Referral to ortho. Work excuse until Monday.

## 2015-09-13 NOTE — Telephone Encounter (Signed)
Error

## 2016-07-30 ENCOUNTER — Encounter: Payer: BLUE CROSS/BLUE SHIELD | Admitting: Internal Medicine

## 2016-09-02 ENCOUNTER — Encounter: Payer: Self-pay | Admitting: Internal Medicine

## 2016-09-02 ENCOUNTER — Ambulatory Visit (INDEPENDENT_AMBULATORY_CARE_PROVIDER_SITE_OTHER): Payer: BLUE CROSS/BLUE SHIELD | Admitting: Internal Medicine

## 2016-09-02 VITALS — BP 114/75 | HR 69 | Temp 98.1°F | Resp 16 | Ht 63.0 in | Wt 198.0 lb

## 2016-09-02 DIAGNOSIS — Z0000 Encounter for general adult medical examination without abnormal findings: Secondary | ICD-10-CM | POA: Diagnosis not present

## 2016-09-02 LAB — CBC WITH DIFFERENTIAL/PLATELET
Basophils Absolute: 0 10*3/uL (ref 0.0–0.1)
Basophils Relative: 0.5 % (ref 0.0–3.0)
Eosinophils Absolute: 0.1 10*3/uL (ref 0.0–0.7)
Eosinophils Relative: 1.4 % (ref 0.0–5.0)
HCT: 37.9 % (ref 36.0–46.0)
Hemoglobin: 12.5 g/dL (ref 12.0–15.0)
Lymphocytes Relative: 44.2 % (ref 12.0–46.0)
Lymphs Abs: 3.1 10*3/uL (ref 0.7–4.0)
MCHC: 32.9 g/dL (ref 30.0–36.0)
MCV: 85.6 fl (ref 78.0–100.0)
Monocytes Absolute: 0.5 10*3/uL (ref 0.1–1.0)
Monocytes Relative: 7.2 % (ref 3.0–12.0)
Neutro Abs: 3.2 10*3/uL (ref 1.4–7.7)
Neutrophils Relative %: 46.7 % (ref 43.0–77.0)
Platelets: 193 10*3/uL (ref 150.0–400.0)
RBC: 4.43 Mil/uL (ref 3.87–5.11)
RDW: 15 % (ref 11.5–15.5)
WBC: 6.9 10*3/uL (ref 4.0–10.5)

## 2016-09-02 LAB — BASIC METABOLIC PANEL
BUN: 13 mg/dL (ref 6–23)
CO2: 28 mEq/L (ref 19–32)
Calcium: 9.2 mg/dL (ref 8.4–10.5)
Chloride: 104 mEq/L (ref 96–112)
Creatinine, Ser: 0.64 mg/dL (ref 0.40–1.20)
GFR: 106.31 mL/min (ref >60.00)
Glucose, Bld: 89 mg/dL (ref 70–99)
Potassium: 4.2 mEq/L (ref 3.5–5.1)
Sodium: 138 mEq/L (ref 135–145)

## 2016-09-02 LAB — POCT URINE PREGNANCY: Preg Test, Ur: NEGATIVE

## 2016-09-02 LAB — LIPID PANEL
Cholesterol: 187 mg/dL (ref 0–200)
HDL: 49.1 mg/dL (ref >39.00)
LDL Cholesterol: 115 mg/dL — ABNORMAL HIGH (ref 0–99)
NonHDL: 138.36
Total CHOL/HDL Ratio: 4
Triglycerides: 118 mg/dL (ref 0.0–149.0)
VLDL: 23.6 mg/dL (ref 0.0–40.0)

## 2016-09-02 LAB — AST: AST: 19 U/L (ref 0–37)

## 2016-09-02 LAB — ALT: ALT: 18 U/L (ref 0–35)

## 2016-09-02 LAB — HEMOGLOBIN A1C: Hgb A1c MFr Bld: 6 % (ref 4.6–6.5)

## 2016-09-02 LAB — TSH: TSH: 1.85 u[IU]/mL (ref 0.35–4.50)

## 2016-09-02 NOTE — Progress Notes (Signed)
Subjective:    Patient ID: Amy Davidson, female    DOB: September 05, 1971, 45 y.o.   MRN: [id number]  DOS:  09/02/2016 Type of visit - description : CPX Interval history: No major concerns. Last year was seen with neck pain, resolved.   Review of Systems   Constitutional: No fever. No chills. No unexplained wt changes. No unusual sweats  HEENT: No dental problems, no ear discharge, no facial swelling, no voice changes. No eye discharge, no eye  redness , no  intolerance to light   Respiratory: No wheezing , no  difficulty breathing. No cough , no mucus production  Cardiovascular: No CP, no leg swelling , no  Palpitations  GI: no nausea, no vomiting, no diarrhea , no  abdominal pain.  No blood in the stools. No dysphagia, no odynophagia    Endocrine: No polyphagia, no polyuria , no polydipsia  GU: No dysuria, gross hematuria, difficulty urinating. No urinary urgency, no frequency. Last menstrual period July 2017, she had mild spotting few days ago but otherwise no  spotting. Hot flashes?..  Musculoskeletal: No joint swellings or unusual aches or pains  Skin: No change in the color of the skin, palor , no  Rash  Allergic, immunologic: No environmental allergies , no  food allergies  Neurological: No dizziness no  syncope. No headaches. No diplopia, no slurred, no slurred speech, no motor deficits, no facial  Numbness  Hematological: No enlarged lymph nodes, no easy bruising , no unusual bleedings  Psychiatry: No suicidal ideas, no hallucinations, no beavior problems, no confusion.  No unusual/severe anxiety, no depression    Past Medical History:  Diagnosis Date  . Contraception    none    Past Surgical History:  Procedure Laterality Date  . CESAREAN SECTION  06/23/2011   Procedure: CESAREAN SECTION;  Surgeon: Leighton Roach Meisinger;  Location: [facility];  Service: Gynecology;  Laterality: N/A;  Primary cesarean section with delivery of baby boy at 82. Apgars 8/9.    Social  History   Social History  . Marital status: Married    Spouse name: N/A  . Number of children: 5  . Years of education: N/A   Occupational History  . stay home [employer]   Social History Main Topics  . Smoking status: Never Smoker  . Smokeless tobacco: Never Used  . Alcohol use No  . Drug use: No  . Sexual activity: Not on file   Other Topics Concern  . Not on file   Social History Narrative   Married, lives w/ husband and 5 children, from [place]     Family History  Problem Relation Age of Onset  . Diabetes Mother   . CAD Neg Hx   . Colon cancer Neg Hx   . Breast cancer Neg Hx        Medication List       Accurate as of 09/02/16 10:09 AM. Always use your most recent med list.          HYDROcodone-acetaminophen 5-325 MG tablet Commonly known as:  NORCO/VICODIN Take 1-2 tablets by mouth every 8 (eight) hours as needed.          Objective:   Physical Exam BP 114/75 (BP Location: Left Arm, Patient Position: Sitting, Cuff Size: Large)   Pulse 69   Temp 98.1 F (36.7 C) (Oral)   Resp 16   Ht 5\' 3"  (1.6 m)   Wt 198 lb (89.8 kg)   LMP 06/20/2016   SpO2  100%   BMI 35.07 kg/m   General:   Well developed, well nourished . NAD.  Neck: No  thyromegaly  HEENT:  Normocephalic . Face symmetric, atraumatic Lungs:  CTA B Normal respiratory effort, no intercostal retractions, no accessory muscle use. Heart: RRR,  no murmur.  No pretibial edema bilaterally  Abdomen:  Not distended, soft, non-tender. No rebound or rigidity.   Skin: Exposed areas without rash. Not pale. Not jaundice Neurologic:  alert & oriented X3.  Speech normal, gait appropriate for age and unassisted Strength symmetric and appropriate for age.  Psych: Cognition and judgment appear intact.  Cooperative with normal attention span and concentration.  Behavior appropriate. No anxious or depressed appearing.    Assessment & Plan:   Assessment Hyperglycemia: A1c 5.9  2016 Contraception-- none  Plan: Neck pain : see last OV, s/o ortho eval, PT, better Hyperglycemia: Check A1c Amenorrhea: UPT negative, menopause?. Will see gynecology soon. RTC one year

## 2016-09-02 NOTE — Progress Notes (Signed)
Pre visit review using our clinic review tool, if applicable. No additional management support is needed unless otherwise documented below in the visit note. 

## 2016-09-02 NOTE — Patient Instructions (Signed)
GO TO THE LAB : Get the blood work     GO TO THE FRONT DESK Schedule your next appointment for a  plate physical exam in one year.

## 2016-09-02 NOTE — Assessment & Plan Note (Signed)
Neck pain : see last OV, s/o ortho eval, PT, better Hyperglycemia: Check A1c Amenorrhea: UPT negative, menopause?. Will see gynecology soon. RTC one year

## 2016-09-02 NOTE — Assessment & Plan Note (Signed)
Td 2012 Female care: Has an appointment ot see  Dr Senaida Oresichardson this month CCS not indicated Labs: BMP, AST, ALT, FLP, CBC, A1c, TSH Diet-exercise discussed

## 2016-09-05 NOTE — Progress Notes (Signed)
Letter sent PC

## 2016-11-21 DIAGNOSIS — Z130 Encounter for screening for diseases of the blood and blood-forming organs and certain disorders involving the immune mechanism: Secondary | ICD-10-CM | POA: Diagnosis not present

## 2016-11-21 DIAGNOSIS — Z6834 Body mass index (BMI) 34.0-34.9, adult: Secondary | ICD-10-CM | POA: Diagnosis not present

## 2016-11-21 DIAGNOSIS — Z01419 Encounter for gynecological examination (general) (routine) without abnormal findings: Secondary | ICD-10-CM | POA: Diagnosis not present

## 2016-11-21 DIAGNOSIS — Z1389 Encounter for screening for other disorder: Secondary | ICD-10-CM | POA: Diagnosis not present

## 2017-03-13 ENCOUNTER — Ambulatory Visit (INDEPENDENT_AMBULATORY_CARE_PROVIDER_SITE_OTHER): Payer: BLUE CROSS/BLUE SHIELD | Admitting: Internal Medicine

## 2017-03-13 ENCOUNTER — Encounter: Payer: Self-pay | Admitting: Internal Medicine

## 2017-03-13 VITALS — BP 118/78 | HR 59 | Temp 97.9°F | Resp 12 | Ht 63.0 in | Wt 186.1 lb

## 2017-03-13 DIAGNOSIS — I8393 Asymptomatic varicose veins of bilateral lower extremities: Secondary | ICD-10-CM | POA: Insufficient documentation

## 2017-03-13 DIAGNOSIS — I8392 Asymptomatic varicose veins of left lower extremity: Secondary | ICD-10-CM | POA: Diagnosis not present

## 2017-03-13 DIAGNOSIS — M722 Plantar fascial fibromatosis: Secondary | ICD-10-CM | POA: Diagnosis not present

## 2017-03-13 NOTE — Progress Notes (Signed)
Subjective:    Patient ID: Amy Davidson, female    DOB: 08-Apr-1971, 46 y.o.   MRN: [id number]  DOS:  03/13/2017 Type of visit - description : ACUTE Interval history: 5 months history of left foot plantar pain, close to the heel, worse first thing in the morning when she stands up, pain is sharp, less noticeable as the day goes by.  Also, several months history of discomfort at the lateral aspect of the left leg, only at night when she lays down on the left side. She denies any burning type feeling there. Is only painful when she lays down on that side.   Review of Systems See last visit, had amenorrhea; resolved. Denies any redness, swelling or injury at the left foot or leg. Denies claudication per se.  Past Medical History:  Diagnosis Date  . Contraception    none    Past Surgical History:  Procedure Laterality Date  . CESAREAN SECTION  06/23/2011   Procedure: CESAREAN SECTION;  Surgeon: Leighton Roach Meisinger;  Location: [facility];  Service: Gynecology;  Laterality: N/A;  Primary cesarean section with delivery of baby boy at 71. Apgars 8/9.    Social History   Social History  . Marital status: Married    Spouse name: N/A  . Number of children: 5  . Years of education: N/A   Occupational History  . factory job    Social History Main Topics  . Smoking status: Never Smoker  . Smokeless tobacco: Never Used  . Alcohol use No  . Drug use: No  . Sexual activity: Not on file   Other Topics Concern  . Not on file   Social History Narrative   Married, lives w/ husband and 5 children, from [place]   Oldest born 2000, all still at home       Allergies as of 03/13/2017   No Known Allergies     Medication List    as of 03/13/2017 12:46 PM   You have not been prescribed any medications.        Objective:   Physical Exam  Musculoskeletal:       Legs:  BP 118/78 (BP Location: Left Arm, Patient Position: Sitting, Cuff Size: Small)   Pulse (!) 59   Temp 97.9  F (36.6 C) (Oral)   Resp 12   Ht  (1.6 m)   Wt 186 lb 2 oz (84.4 kg)   SpO2 96%   BMI 32.97 kg/m  General:   Well developed, well nourished . NAD.  HEENT:  Normocephalic . Face symmetric, atraumatic MSK: Right foot normal, good pedal pulse Left foot: wnl to inspection and palpation, good pedal pulse, slightly TTP at the plantar aspect near the heel. Both lower extremities have varicose veins, non seem red, swelling or particularly tender. Calves are symmetric and no TTP.  Skin: Not pale. Not jaundice Neurologic:  alert & oriented X3.  Speech normal, gait appropriate for age and unassisted Psych--  Cognition and judgment appear intact.  Cooperative with normal attention span and concentration.  Behavior appropriate. No anxious or depressed appearing.      Assessment & Plan:   Assessment Hyperglycemia: A1c 5.9 2016 Contraception-- none  Plan: Plantar fasciitis, left foot: Stretching discuss w/ patient, recommend to do that twice a day consistently, call if not better. Varicose veins: Chronic, states veins are no worse than before, no phlebitis on exam today. Recommend to start using compression stockings. Pain, left leg: No claudication, no neuropathy  type of sxs, related to varicose veins? With treat varicose veins, if not better she is to let me know. amenorrhea: See last OV, period came back and they are now somewhat irregular, she saw gynecology, early menopause? All instructions discussing the Spanish.

## 2017-03-13 NOTE — Progress Notes (Signed)
Pre visit review using our clinic review tool, if applicable. No additional management support is needed unless otherwise documented below in the visit note. 

## 2017-03-13 NOTE — Patient Instructions (Signed)
  HAGA LOS ESTIRAMIENTOS VARIAS VECES EN LA MAN~ANA Y VARIAS VECES EN LA TARDE PARA EL DOLOR DE EL PIE IZQUIERDO   USE "COMPRESSION STOCKINGS" (MEDIAS EPRETADAS) TODOS LOS DIAS PARA ALIVIAR LAS VARICES   PUEDE COMPRAR "COMPRESSION STOCKINGS"  EN UNA TIENDA QIE SE [facility]  [address] Open Saturdays 16:10 AM - 1:00 PM Monday - Friday 9:00 AM - 5:30 PM  LLAME SI NO MEJORA EN LAS SIGUIENTES SEMANAS

## 2017-03-13 NOTE — Assessment & Plan Note (Signed)
Plantar fasciitis, left foot: Stretching discuss w/ patient, recommend to do that twice a day consistently, call if not better. Varicose veins: Chronic, states veins are no worse than before, no phlebitis on exam today. Recommend to start using compression stockings. Pain, left leg: No claudication, no neuropathy type of sxs, related to varicose veins? With treat varicose veins, if not better she is to let me know. amenorrhea: See last OV, period came back and they are now somewhat irregular, she saw gynecology, early menopause? All instructions discussing the Spanish.

## 2017-10-03 ENCOUNTER — Ambulatory Visit: Payer: BLUE CROSS/BLUE SHIELD | Admitting: Internal Medicine

## 2017-10-03 ENCOUNTER — Encounter: Payer: Self-pay | Admitting: Internal Medicine

## 2017-10-03 VITALS — BP 108/68 | HR 67 | Temp 98.1°F | Resp 14 | Ht 63.0 in | Wt 198.2 lb

## 2017-10-03 DIAGNOSIS — M791 Myalgia, unspecified site: Secondary | ICD-10-CM | POA: Diagnosis not present

## 2017-10-03 DIAGNOSIS — R635 Abnormal weight gain: Secondary | ICD-10-CM | POA: Diagnosis not present

## 2017-10-03 DIAGNOSIS — R739 Hyperglycemia, unspecified: Secondary | ICD-10-CM | POA: Diagnosis not present

## 2017-10-03 NOTE — Progress Notes (Signed)
Subjective:    Patient ID: Dellis Filbertomitila Kozak, female    DOB: 12-07-1970, 46 y.o.   MRN: [id number]  DOS:  10/03/2017 Type of visit - description : acute Interval history: The patient's chief complaint is myalgias.  States that for several months she is having on and off pain, points to the thighs, calves, arms and forearms.  It hurts most days, she did not prompt arthralgias but when asked she said that her hips hurt sometimes but definitely not daily. 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  I noted weight gain, she reports that her diet has not changed. I asked about her LMP, she said was May, she is quite irregular and previously thought to be early  menopause.  Strongly declined a pregnancy test.  Wt Readings from Last 3 Encounters:  10/03/17 198 lb 4 oz (89.9 kg)  03/13/17 186 lb 2 oz (84.4 kg)  09/02/16 198 lb (89.8 kg)     Review of Systems  Denies fever, chills.  No weight loss actually has gained weight over the last 2 months only. No chest pain no difficulty breathing No neck or back pain per se Denies any unusual stress, anxiety or depression.  Past Medical History:  Diagnosis Date  . Contraception    none    No past surgical history on file.  Social History   Socioeconomic History  . Marital status: Married    Spouse name: Not on file  . Number of children: 5  . Years of education: Not on file  . Highest education level: Not on file  Social Needs  . Financial resource strain: Not on file  . Food insecurity - worry: Not on file  . Food insecurity - inability: Not on file  . Transportation needs - medical: Not on file  . Transportation needs - non-medical: Not on file  Occupational History  . Occupation: factory job  Tobacco Use  . Smoking status: Never Smoker  . Smokeless tobacco: Never Used  Substance and Sexual Activity  . Alcohol use: No    Alcohol/week: 0.0 oz  . Drug use: No  . Sexual activity: Not on file  Other  Topics Concern  . Not on file  Social History Narrative   Married, lives w/ husband and 5 children, from [place].   Oldest born 2000, all still at home       Allergies as of 10/03/2017   No Known Allergies     Medication List    as of 10/03/2017  3:33 PM   You have not been prescribed any medications.        Objective:   Physical Exam BP 108/68 (BP Location: Left Arm, Patient Position: Sitting, Cuff Size: Small)   Pulse 67   Temp 98.1 F (36.7 C) (Oral)   Resp 14   Ht 5\' 3"  (1.6 m)   Wt 198 lb 4 oz (89.9 kg)   SpO2 98%   BMI 35.12 kg/m  General:   Well developed, well nourished . NAD.  HEENT:  Normocephalic . Face symmetric, atraumatic Neck: No thyromegaly Lungs:  CTA B Normal respiratory effort, no intercostal retractions, no accessory muscle use. Heart: RRR,  no murmur.  no pretibial edema bilaterally  MSK: Hands and wrists without synovitis, not TTP at the major muscle groups. Abdomen:  Not distended, soft, non-tender. No rebound or rigidity.   Skin: Not pale. Not jaundice Neurologic:  alert & oriented X3.  Speech normal,  gait appropriate for age and unassisted Psych--  Cognition and judgment appear intact.  Cooperative with normal attention span and concentration.  Behavior appropriate. No anxious or depressed appearing.     Assessment & Plan:   Assessment Hyperglycemia: A1c 5.9 2016 Contraception-- none  Plan: Myalgias: C/o myalgias most days, at different muscle groups and at different times, sometimes in the upper extremities sometimes in the lower extremities.  No arthralgias per se.  No axial pain.  Etiology unclear, will check B12, vitamin D, sed rate, total CK, ANA, rheumatoid factor.  Reassess on RTC Weight gain: For the last 2 months according to the patient, still active, diet has not changed, will check a TSH Hyperglycemia: Check a A1c, BMP  RTC 2 months, fasting, physical exam

## 2017-10-03 NOTE — Patient Instructions (Addendum)
GO TO THE LAB : Get the blood work     GO TO THE FRONT DESK Schedule your next appointment for a physical exam in 2 months, fasting

## 2017-10-03 NOTE — Progress Notes (Signed)
Pre visit review using our clinic review tool, if applicable. No additional management support is needed unless otherwise documented below in the visit note. 

## 2017-10-05 NOTE — Assessment & Plan Note (Signed)
Myalgias: C/o myalgias most days, at different muscle groups and at different times, sometimes in the upper extremities sometimes in the lower extremities.  No arthralgias per se.  No axial pain.  Etiology unclear, will check B12, vitamin D, sed rate, total CK, ANA, rheumatoid factor.  Reassess on RTC Weight gain: For the last 2 months according to the patient, still active, diet has not changed, will check a TSH Hyperglycemia: Check a A1c, BMP  RTC 2 months, fasting, physical exam

## 2017-10-08 LAB — CBC WITH DIFFERENTIAL/PLATELET
Basophils Absolute: 61 {cells}/uL (ref 0–200)
Basophils Relative: 0.9 %
Eosinophils Absolute: 150 {cells}/uL (ref 15–500)
Eosinophils Relative: 2.2 %
HCT: 38.2 % (ref 35.0–45.0)
Hemoglobin: 12.7 g/dL (ref 11.7–15.5)
Lymphs Abs: 2434 {cells}/uL (ref 850–3900)
MCH: 28.4 pg (ref 27.0–33.0)
MCHC: 33.2 g/dL (ref 32.0–36.0)
MCV: 85.5 fL (ref 80.0–100.0)
MPV: 12.2 fL (ref 7.5–12.5)
Monocytes Relative: 6 %
Neutro Abs: 3747 {cells}/uL (ref 1500–7800)
Neutrophils Relative %: 55.1 %
Platelets: 245 10*3/uL (ref 140–400)
RBC: 4.47 Million/uL (ref 3.80–5.10)
RDW: 12.6 % (ref 11.0–15.0)
Total Lymphocyte: 35.8 %
WBC mixed population: 408 {cells}/uL (ref 200–950)
WBC: 6.8 10*3/uL (ref 3.8–10.8)

## 2017-10-08 LAB — BASIC METABOLIC PANEL
BUN: 15 mg/dL (ref 7–25)
CO2: 28 mmol/L (ref 20–32)
Calcium: 9.3 mg/dL (ref 8.6–10.2)
Chloride: 101 mmol/L (ref 98–110)
Creat: 0.76 mg/dL (ref 0.50–1.10)
Glucose, Bld: 94 mg/dL (ref 65–99)
Potassium: 4.2 mmol/L (ref 3.5–5.3)
Sodium: 138 mmol/L (ref 135–146)

## 2017-10-08 LAB — HEMOGLOBIN A1C
Hgb A1c MFr Bld: 6 %{Hb} — ABNORMAL HIGH
Mean Plasma Glucose: 126 (calc)
eAG (mmol/L): 7 (calc)

## 2017-10-08 LAB — FOLATE: Folate: 15.7 ng/mL

## 2017-10-08 LAB — TSH: TSH: 2.31 m[IU]/L

## 2017-10-08 LAB — SEDIMENTATION RATE: Sed Rate: 9 mm/h (ref 0–20)

## 2017-10-08 LAB — VITAMIN D 1,25 DIHYDROXY
Vitamin D 1, 25 (OH)2 Total: 56 pg/mL (ref 18–72)
Vitamin D2 1, 25 (OH)2: <8 pg/mL
Vitamin D3 1, 25 (OH)2: 56 pg/mL

## 2017-10-08 LAB — ANA: Anti Nuclear Antibody(ANA): NEGATIVE

## 2017-10-08 LAB — RHEUMATOID FACTOR: Rhuematoid fact SerPl-aCnc: <14 IU/mL (ref <14)

## 2017-10-08 LAB — CK: Total CK: 138 U/L (ref 29–143)

## 2017-10-08 LAB — VITAMIN B12: Vitamin B-12: 501 pg/mL (ref 200–1100)

## 2017-12-03 ENCOUNTER — Ambulatory Visit: Payer: BLUE CROSS/BLUE SHIELD | Admitting: Internal Medicine

## 2018-08-27 ENCOUNTER — Encounter: Payer: Self-pay | Admitting: Internal Medicine

## 2018-08-27 ENCOUNTER — Ambulatory Visit (INDEPENDENT_AMBULATORY_CARE_PROVIDER_SITE_OTHER): Payer: BLUE CROSS/BLUE SHIELD | Admitting: Internal Medicine

## 2018-08-27 VITALS — BP 120/64 | HR 63 | Temp 98.4°F | Resp 14 | Ht 63.0 in | Wt 205.1 lb

## 2018-08-27 DIAGNOSIS — R739 Hyperglycemia, unspecified: Secondary | ICD-10-CM

## 2018-08-27 DIAGNOSIS — Z0000 Encounter for general adult medical examination without abnormal findings: Secondary | ICD-10-CM | POA: Diagnosis not present

## 2018-08-27 DIAGNOSIS — Z23 Encounter for immunization: Secondary | ICD-10-CM

## 2018-08-27 DIAGNOSIS — N912 Amenorrhea, unspecified: Secondary | ICD-10-CM | POA: Diagnosis not present

## 2018-08-27 LAB — COMPREHENSIVE METABOLIC PANEL
ALT: 16 U/L (ref 0–35)
AST: 16 U/L (ref 0–37)
Albumin: 4.5 g/dL (ref 3.5–5.2)
Alkaline Phosphatase: 68 U/L (ref 39–117)
BUN: 11 mg/dL (ref 6–23)
CO2: 33 mEq/L — ABNORMAL HIGH (ref 19–32)
Calcium: 9.3 mg/dL (ref 8.4–10.5)
Chloride: 104 mEq/L (ref 96–112)
Creatinine, Ser: 0.67 mg/dL (ref 0.40–1.20)
GFR: 99.97 mL/min (ref >60.00)
Glucose, Bld: 99 mg/dL (ref 70–99)
Potassium: 3.9 mEq/L (ref 3.5–5.1)
Sodium: 141 mEq/L (ref 135–145)
Total Bilirubin: 0.4 mg/dL (ref 0.2–1.2)
Total Protein: 7.2 g/dL (ref 6.0–8.3)

## 2018-08-27 LAB — CBC WITH DIFFERENTIAL/PLATELET
Basophils Absolute: 0.1 10*3/uL (ref 0.0–0.1)
Basophils Relative: 1.2 % (ref 0.0–3.0)
Eosinophils Absolute: 0.1 10*3/uL (ref 0.0–0.7)
Eosinophils Relative: 1.9 % (ref 0.0–5.0)
HCT: 38.5 % (ref 36.0–46.0)
Hemoglobin: 12.6 g/dL (ref 12.0–15.0)
Lymphocytes Relative: 40 % (ref 12.0–46.0)
Lymphs Abs: 2.1 10*3/uL (ref 0.7–4.0)
MCHC: 32.9 g/dL (ref 30.0–36.0)
MCV: 88 fl (ref 78.0–100.0)
Monocytes Absolute: 0.3 10*3/uL (ref 0.1–1.0)
Monocytes Relative: 5 % (ref 3.0–12.0)
Neutro Abs: 2.7 10*3/uL (ref 1.4–7.7)
Neutrophils Relative %: 51.9 % (ref 43.0–77.0)
Platelets: 212 10*3/uL (ref 150.0–400.0)
RBC: 4.37 Mil/uL (ref 3.87–5.11)
RDW: 14.2 % (ref 11.5–15.5)
WBC: 5.3 10*3/uL (ref 4.0–10.5)

## 2018-08-27 LAB — LIPID PANEL
Cholesterol: 200 mg/dL (ref 0–200)
HDL: 48 mg/dL (ref >39.00)
LDL Cholesterol: 131 mg/dL — ABNORMAL HIGH (ref 0–99)
NonHDL: 152.24
Total CHOL/HDL Ratio: 4
Triglycerides: 107 mg/dL (ref 0.0–149.0)
VLDL: 21.4 mg/dL (ref 0.0–40.0)

## 2018-08-27 LAB — HEMOGLOBIN A1C: Hgb A1c MFr Bld: 6.6 % — ABNORMAL HIGH (ref 4.6–6.5)

## 2018-08-27 LAB — TSH: TSH: 2.06 u[IU]/mL (ref 0.35–4.50)

## 2018-08-27 NOTE — Patient Instructions (Signed)
GO TO THE LAB : Get the blood work     GO TO THE FRONT DESK Schedule your next appointment for a  Physical exam in 1 year 

## 2018-08-27 NOTE — Assessment & Plan Note (Addendum)
Td 2012.  Flu shot today Female care:  To see gynecology this month.  Had a mammogram 2016 and a Pap smear 2015. CCS not indicated Labs: cmp flp cbc A1c tsh and UPT Diet-exercise discussed

## 2018-08-27 NOTE — Assessment & Plan Note (Signed)
Myalgias: See last visit, work-up negative, symptoms resolved Hyperglycemia: Checking A1c Menopausal?  Most likely, check a UPT, to see gynecology soon. RTC 1 year

## 2018-08-27 NOTE — Progress Notes (Signed)
Subjective:    Patient ID: Amy Davidson, female    DOB: 11/02/1971, 47 y.o.   MRN: [id number]  DOS:  08/27/2018 Type of visit - description : cpx Interval history: No concerns Wt Readings from Last 3 Encounters:  08/27/18 205 lb 2 oz (93 kg)  10/03/17 198 lb 4 oz (89.9 kg)  03/13/17 186 lb 2 oz (84.4 kg)    Review of Systems Last menstrual period was in December 2018.  Denies any pelvic pain or spotting.  Occasionally has hot flashes.  Other than above, a 14 point review of systems is negative    Past Medical History:  Diagnosis Date  . Contraception    none    Past Surgical History:  Procedure Laterality Date  . CESAREAN SECTION  06/23/2011   Procedure: CESAREAN SECTION;  Surgeon: Leighton Roach Meisinger;  Location: [facility];  Service: Gynecology;  Laterality: N/A;  Primary cesarean section with delivery of baby boy at 64. Apgars 8/9.    Social History   Socioeconomic History  . Marital status: Married    Spouse name: Not on file  . Number of children: 5  . Years of education: Not on file  . Highest education level: Not on file  Occupational History  . Occupation: factory job  Engineer, production  . Financial resource strain: Not on file  . Food insecurity:    Worry: Not on file    Inability: Not on file  . Transportation needs:    Medical: Not on file    Non-medical: Not on file  Tobacco Use  . Smoking status: Never Smoker  . Smokeless tobacco: Never Used  Substance and Sexual Activity  . Alcohol use: No    Alcohol/week: 0.0 standard drinks  . Drug use: No  . Sexual activity: Not on file  Lifestyle  . Physical activity:    Days per week: Not on file    Minutes per session: Not on file  . Stress: Not on file  Relationships  . Social connections:    Talks on phone: Not on file    Gets together: Not on file    Attends religious service: Not on file    Active member of club or organization: Not on file    Attends meetings of clubs or organizations: Not on file      Relationship status: Not on file  . Intimate partner violence:    Fear of current or ex partner: Not on file    Emotionally abused: Not on file    Physically abused: Not on file    Forced sexual activity: Not on file  Other Topics Concern  . Not on file  Social History Narrative   Married, lives w/ husband and 5 children, from [place]   Oldest born 2000   Household: pt, husband, 5 children      Family History  Problem Relation Age of Onset  . Diabetes Mother   . CAD Neg Hx   . Colon cancer Neg Hx   . Breast cancer Neg Hx      Allergies as of 08/27/2018   No Known Allergies     Medication List    as of 08/27/2018  9:31 PM   You have not been prescribed any medications.        Objective:   Physical Exam BP 120/64 (BP Location: Left Arm, Patient Position: Sitting, Cuff Size: Small)   Pulse 63   Temp 98.4 F (36.9 C) (Oral)   Resp  14   Ht 5\' 3"  (1.6 m)   Wt 205 lb 2 oz (93 kg)   SpO2 97%   BMI 36.34 kg/m  General: Well developed, NAD, see BMI.  Neck: No  thyromegaly  HEENT:  Normocephalic . Face symmetric, atraumatic Lungs:  CTA B Normal respiratory effort, no intercostal retractions, no accessory muscle use. Heart: RRR,  no murmur.  No pretibial edema bilaterally  Abdomen:  Not distended, soft, non-tender. No rebound or rigidity.   Skin: Exposed areas without rash. Not pale. Not jaundice Neurologic:  alert & oriented X3.  Speech normal, gait appropriate for age and unassisted Strength symmetric and appropriate for age.  Psych: Cognition and judgment appear intact.  Cooperative with normal attention span and concentration.  Behavior appropriate. No anxious or depressed appearing.     Assessment & Plan:   Assessment Hyperglycemia: A1c 5.9 2016 Contraception-- none  Plan: Myalgias: See last visit, work-up negative, symptoms resolved Hyperglycemia: Checking A1c Menopausal?  Most likely, check a UPT, to see gynecology soon. RTC 1 year

## 2018-08-27 NOTE — Progress Notes (Signed)
Pre visit review using our clinic review tool, if applicable. No additional management support is needed unless otherwise documented below in the visit note. 

## 2018-08-28 LAB — PREGNANCY, URINE: Preg Test, Ur: NEGATIVE

## 2018-09-04 ENCOUNTER — Telehealth: Payer: Self-pay

## 2018-09-04 DIAGNOSIS — E7800 Pure hypercholesterolemia, unspecified: Secondary | ICD-10-CM

## 2018-09-04 NOTE — Telephone Encounter (Signed)
Copied from CRM [id number]. Topic: General - Other >> Sep 04, 2018  2:59 PM Tamela Oddi wrote: Reason for CRM: Patient is returning call to Dr. Drue Novel office regarding test results.  Please call patient back at [phone].

## 2018-09-07 NOTE — Telephone Encounter (Signed)
L/m for patient to call back

## 2018-09-08 NOTE — Telephone Encounter (Signed)
Patient called practice back she was returning call from [facility] for results .

## 2018-09-10 MED ORDER — ATORVASTATIN CALCIUM 10 MG PO TABS: 10.0000 mg | ORAL_TABLET | Freq: Every day | ORAL | 2 refills | Status: DC

## 2018-09-10 NOTE — Telephone Encounter (Signed)
Pt. returned call, and via phone interpreter, author relayed recent lab results. Diet reviewed with pt., and new rx lipitor ordered, pharmacy confirmed. Pt. verbalized understanding of results and instructions. Pt. stated she would call back to make her 2 month f/u appointment per Dr. Drue Novel request.

## 2018-10-20 DIAGNOSIS — Z1231 Encounter for screening mammogram for malignant neoplasm of breast: Secondary | ICD-10-CM | POA: Diagnosis not present

## 2018-10-20 DIAGNOSIS — Z6834 Body mass index (BMI) 34.0-34.9, adult: Secondary | ICD-10-CM | POA: Diagnosis not present

## 2018-10-20 DIAGNOSIS — Z01419 Encounter for gynecological examination (general) (routine) without abnormal findings: Secondary | ICD-10-CM | POA: Diagnosis not present

## 2018-10-20 DIAGNOSIS — Z1329 Encounter for screening for other suspected endocrine disorder: Secondary | ICD-10-CM | POA: Diagnosis not present

## 2018-10-20 DIAGNOSIS — Z1389 Encounter for screening for other disorder: Secondary | ICD-10-CM | POA: Diagnosis not present

## 2018-10-20 DIAGNOSIS — Z130 Encounter for screening for diseases of the blood and blood-forming organs and certain disorders involving the immune mechanism: Secondary | ICD-10-CM | POA: Diagnosis not present

## 2018-10-20 LAB — HM PAP SMEAR

## 2018-10-20 LAB — TSH: TSH: 2.94 (ref 0.41–5.90)

## 2018-10-20 LAB — CBC AND DIFFERENTIAL: Hemoglobin: 12.8 (ref 12.0–16.0)

## 2018-10-20 LAB — HM MAMMOGRAPHY

## 2018-10-30 ENCOUNTER — Encounter: Payer: Self-pay | Admitting: Internal Medicine

## 2018-10-30 ENCOUNTER — Ambulatory Visit: Payer: BLUE CROSS/BLUE SHIELD | Admitting: Internal Medicine

## 2018-10-30 VITALS — BP 116/78 | HR 86 | Temp 98.0°F | Resp 16 | Ht 63.0 in | Wt 199.2 lb

## 2018-10-30 DIAGNOSIS — E119 Type 2 diabetes mellitus without complications: Secondary | ICD-10-CM | POA: Insufficient documentation

## 2018-10-30 DIAGNOSIS — E785 Hyperlipidemia, unspecified: Secondary | ICD-10-CM | POA: Insufficient documentation

## 2018-10-30 DIAGNOSIS — E7800 Pure hypercholesterolemia, unspecified: Secondary | ICD-10-CM | POA: Diagnosis not present

## 2018-10-30 HISTORY — DX: Type 2 diabetes mellitus without complications: E11.9

## 2018-10-30 MED ORDER — ATORVASTATIN CALCIUM 10 MG PO TABS: 10.0000 mg | ORAL_TABLET | Freq: Every day | ORAL | 0 refills | Status: DC

## 2018-10-30 NOTE — Progress Notes (Signed)
Subjective:    Patient ID: Amy Davidson, female    DOB: [birth year], 47 y.o.   MRN: [id number]  DOS:  10/30/2018 Type of visit - description : rov DM: Diagnosis since the last visit High cholesterol: Took Lipitor for a month.  Then stopped.  Did not realize that she needed to continue taking it. Reports aches and pains for the last month, mostly at the elbows and back.  She works in [employer], and uses her arms all day long.  No swelling.  Wt Readings from Last 3 Encounters:  10/30/18 199 lb 4 oz (90.4 kg)  08/27/18 205 lb 2 oz (93 kg)  10/03/17 198 lb 4 oz (89.9 kg)    Review of Systems   Past Medical History:  Diagnosis Date  . Contraception    none  . Diabetes mellitus without complication (HCC) 10/30/2018    Past Surgical History:  Procedure Laterality Date  . CESAREAN SECTION  06/23/2011   Procedure: CESAREAN SECTION;  Surgeon: Leighton Roachodd D Meisinger;  Location: [facility];  Service: Gynecology;  Laterality: N/A;  Primary cesarean section with delivery of baby boy at 191502. Apgars 8/9.    Social History   Socioeconomic History  . Marital status: Married    Spouse name: Not on file  . Number of children: 5  . Years of education: Not on file  . Highest education level: Not on file  Occupational History  . Occupation: factory job  Engineer, productionocial Needs  . Financial resource strain: Not on file  . Food insecurity:    Worry: Not on file    Inability: Not on file  . Transportation needs:    Medical: Not on file    Non-medical: Not on file  Tobacco Use  . Smoking status: Never Smoker  . Smokeless tobacco: Never Used  Substance and Sexual Activity  . Alcohol use: No    Alcohol/week: 0.0 standard drinks  . Drug use: No  . Sexual activity: Not on file  Lifestyle  . Physical activity:    Days per week: Not on file    Minutes per session: Not on file  . Stress: Not on file  Relationships  . Social connections:    Talks on phone: Not on file    Gets together: Not on file   Attends religious service: Not on file    Active member of club or organization: Not on file    Attends meetings of clubs or organizations: Not on file    Relationship status: Not on file  . Intimate partner violence:    Fear of current or ex partner: Not on file    Emotionally abused: Not on file    Physically abused: Not on file    Forced sexual activity: Not on file  Other Topics Concern  . Not on file  Social History Narrative   Married, lives w/ husband and 5 children, from [place].   Oldest born 2000   Household: pt, husband, 5 children       Allergies as of 10/30/2018   No Known Allergies     Medication List        Accurate as of 10/30/18 11:59 PM. Always use your most recent med list.          atorvastatin 10 MG tablet Commonly known as:  LIPITOR Take 1 tablet (10 mg total) by mouth daily at 6 PM.           Objective:   Physical Exam BP  116/78 (BP Location: Left Arm, Patient Position: Sitting, Cuff Size: Normal)   Pulse 86   Temp 98 F (36.7 C) (Oral)   Resp 16   Ht 5\' 3"  (1.6 m)   Wt 199 lb 4 oz (90.4 kg)   SpO2 98%   BMI 35.30 kg/m  General:   Well developed, NAD, BMI noted. HEENT:  Normocephalic . Face symmetric, atraumatic Lungs:  CTA B Normal respiratory effort, no intercostal retractions, no accessory muscle use. Heart: RRR,  no murmur.  No pretibial edema bilaterally MSK: Wrists and elbows normal to inspection and palpation. Skin: Not pale. Not jaundice Neurologic:  alert & oriented X3.  Speech normal, gait appropriate for age and unassisted Psych--  Cognition and judgment appear intact.  Cooperative with normal attention span and concentration.  Behavior appropriate. No anxious or depressed appearing.      Assessment & Plan:    Assessment DM:  A1c 5.9 2016; A1C 6.30 August 2018 Hyperlipidemia  Contraception-- none  Plan: DM: A1c was 6.6.  Since then she is trying to eat healthier with less carbohydrates, has lost some  weight.  Praised. Recheck a a1c on RTC Hyperlipidemia: Based on her CV RF was prescribed Lipitor in October, took it for a month, no side effects, she simply stopped.  Recommend to restart, Rx sent.  Recheck labs when she comes back. Pain, elbows, back: Today reports aches and pains in the back and elbows, elbows normal on exam.  Has on and off pain at different places, doubt related to Lipitor (she stopped 4 weeks ago).  Previous labs were negative.  Recommend Tylenol as needed.  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 RTC 2 months

## 2018-10-30 NOTE — Progress Notes (Signed)
Pre visit review using our clinic review tool, if applicable. No additional management support is needed unless otherwise documented below in the visit note. 

## 2018-10-30 NOTE — Patient Instructions (Addendum)
GO TO THE LAB : Get the blood work     GO TO THE FRONT DESK Schedule your next appointment for a  Check up in 2 months, fasting  REGRESE EN 2 MESES PARA UN CHEQUEO, EN AYUNAS TOME ATORVASTATINA TODOS LOS DIAS   TYLENOL PARA EL DOLOR

## 2018-11-01 NOTE — Assessment & Plan Note (Signed)
DM: A1c was 6.6.  Since then she is trying to eat healthier with less carbohydrates, has lost some weight.  Praised. Recheck a a1c on RTC Hyperlipidemia: Based on her CV RF was prescribed Lipitor in October, took it for a month, no side effects, she simply stopped.  Recommend to restart, Rx sent.  Recheck labs when she comes back. Pain, elbows, back: Today reports aches and pains in the back and elbows, elbows normal on exam.  Has on and off pain at different places, doubt related to Lipitor (she stopped 4 weeks ago).  Previous labs were negative.  Recommend Tylenol as needed.  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 RTC 2 months

## 2018-11-02 ENCOUNTER — Encounter: Payer: Self-pay | Admitting: Internal Medicine

## 2018-11-09 ENCOUNTER — Other Ambulatory Visit: Payer: Self-pay | Admitting: Obstetrics and Gynecology

## 2018-11-09 DIAGNOSIS — N6489 Other specified disorders of breast: Secondary | ICD-10-CM

## 2018-11-23 ENCOUNTER — Telehealth: Payer: Self-pay | Admitting: Internal Medicine

## 2018-11-23 NOTE — Telephone Encounter (Signed)
Pt's spouse returned call. Advised per PCP note left. Spouse says that pt is currently at work and that they are aware of results. I did advise to make F/U apt per results per PCP. Spouse says that he will discuss with pt and then schedule f/u

## 2018-11-23 NOTE — Telephone Encounter (Signed)
I was able to spoke with the patient's husband, he and the patient know that she needs a follow-up MMG; he plans to call gynecology tomorrow and set that up.  I stressed the importance of follow-up, knows to call me if he needs help scheduling the test

## 2018-11-23 NOTE — Telephone Encounter (Signed)
[facility], date of the exam was 10/20/2018. [facility] they contacted the patient's husband and asked him to make an appointment for f/u but that has not happened. I called the patient and her husband to be sure they proceed with further eval, unable to talk to them. Will try again later

## 2018-11-30 ENCOUNTER — Ambulatory Visit: Payer: BLUE CROSS/BLUE SHIELD

## 2018-11-30 ENCOUNTER — Ambulatory Visit
Admission: RE | Admit: 2018-11-30 | Discharge: 2018-11-30 | Disposition: A | Discharge status: Home / Self Care | Payer: BLUE CROSS/BLUE SHIELD | Source: Ambulatory Visit | Attending: Obstetrics and Gynecology | Admitting: Obstetrics and Gynecology

## 2018-11-30 DIAGNOSIS — R928 Other abnormal and inconclusive findings on diagnostic imaging of breast: Secondary | ICD-10-CM | POA: Diagnosis not present

## 2018-11-30 DIAGNOSIS — N6489 Other specified disorders of breast: Secondary | ICD-10-CM

## 2019-01-01 ENCOUNTER — Ambulatory Visit: Payer: BLUE CROSS/BLUE SHIELD | Admitting: Internal Medicine

## 2019-01-06 ENCOUNTER — Encounter: Payer: Self-pay | Admitting: Internal Medicine

## 2019-01-06 ENCOUNTER — Ambulatory Visit: Payer: BLUE CROSS/BLUE SHIELD | Admitting: Internal Medicine

## 2019-01-06 VITALS — BP 128/80 | HR 61 | Temp 98.1°F | Resp 16 | Ht 63.0 in | Wt 198.5 lb

## 2019-01-06 DIAGNOSIS — Z23 Encounter for immunization: Secondary | ICD-10-CM | POA: Diagnosis not present

## 2019-01-06 DIAGNOSIS — E119 Type 2 diabetes mellitus without complications: Secondary | ICD-10-CM

## 2019-01-06 DIAGNOSIS — E785 Hyperlipidemia, unspecified: Secondary | ICD-10-CM | POA: Diagnosis not present

## 2019-01-06 LAB — AST: AST: 20 U/L (ref 0–37)

## 2019-01-06 LAB — LIPID PANEL
Cholesterol: 149 mg/dL (ref 0–200)
HDL: 46.2 mg/dL (ref >39.00)
LDL Cholesterol: 84 mg/dL (ref 0–99)
NonHDL: 102.75
Total CHOL/HDL Ratio: 3
Triglycerides: 96 mg/dL (ref 0.0–149.0)
VLDL: 19.2 mg/dL (ref 0.0–40.0)

## 2019-01-06 LAB — MICROALBUMIN / CREATININE URINE RATIO
Creatinine,U: 111.4 mg/dL
Microalb Creat Ratio: 0.8 mg/g (ref 0.0–30.0)
Microalb, Ur: 0.9 mg/dL (ref 0.0–1.9)

## 2019-01-06 LAB — ALT: ALT: 19 U/L (ref 0–35)

## 2019-01-06 LAB — HEMOGLOBIN A1C: Hgb A1c MFr Bld: 6.4 % (ref 4.6–6.5)

## 2019-01-06 NOTE — Assessment & Plan Note (Signed)
DM: We again discussed diet, exercise; check a A1c and micro.  Reports she had an eye exam few months ago. Foot exam (-) today High cholesterol: Currently taking Lipitor, checking a FLP, AST, ALT Elbow B & upper back pain: The patient works on her feet 8 to 10 hours daily, she reports uses her arms a lot.  Believe her pain is overuse, recommend stretching and  changing positions frequently. Varicose veins: Previously I advised her to use  compression stockings, when she does, she feels better, encouraged to continue using them Preventive care: PNM 23 today. Menopausal LMP > 1 year RTC 6 months

## 2019-01-06 NOTE — Progress Notes (Signed)
Subjective:    Patient ID: Amy Davidson, female    DOB: [birth year], 48 y.o.   MRN: [id number]  DOS:  01/06/2019 Type of visit - description: Follow-up DM: Is trying to eat healthier with less carbohydrates High cholesterol: Denies any problems taking Lipitor, no unusual aches-pains.  She continue with upper back and elbow pain but at baseline. Menopausal: Saw gynecology last year.  DX menopausal   Review of Systems Denies paresthesias.  No edema.  Past Medical History:  Diagnosis Date  . Contraception    none  . Diabetes mellitus without complication (HCC) 10/30/2018    Past Surgical History:  Procedure Laterality Date  . CESAREAN SECTION  06/23/2011   Procedure: CESAREAN SECTION;  Surgeon: Leighton Roach Meisinger;  Location: [facility];  Service: Gynecology;  Laterality: N/A;  Primary cesarean section with delivery of baby boy at 10. Apgars 8/9.    Social History   Socioeconomic History  . Marital status: Married    Spouse name: Not on file  . Number of children: 5  . Years of education: Not on file  . Highest education level: Not on file  Occupational History  . Occupation: factory job  Engineer, production  . Financial resource strain: Not on file  . Food insecurity:    Worry: Not on file    Inability: Not on file  . Transportation needs:    Medical: Not on file    Non-medical: Not on file  Tobacco Use  . Smoking status: Never Smoker  . Smokeless tobacco: Never Used  Substance and Sexual Activity  . Alcohol use: No    Alcohol/week: 0.0 standard drinks  . Drug use: No  . Sexual activity: Not on file  Lifestyle  . Physical activity:    Days per week: Not on file    Minutes per session: Not on file  . Stress: Not on file  Relationships  . Social connections:    Talks on phone: Not on file    Gets together: Not on file    Attends religious service: Not on file    Active member of club or organization: Not on file    Attends meetings of clubs or organizations: Not on  file    Relationship status: Not on file  . Intimate partner violence:    Fear of current or ex partner: Not on file    Emotionally abused: Not on file    Physically abused: Not on file    Forced sexual activity: Not on file  Other Topics Concern  . Not on file  Social History Narrative   Married, lives w/ husband and 5 children, from [place]   Oldest born 2000   Household: pt, husband, 5 children       Allergies as of 01/06/2019   No Known Allergies     Medication List       Accurate as of January 06, 2019  9:39 AM. Always use your most recent med list.        atorvastatin 10 MG tablet Commonly known as:  LIPITOR Take 1 tablet (10 mg total) by mouth daily at 6 PM.           Objective:   Physical Exam BP 128/80 (BP Location: Left Arm, Patient Position: Sitting, Cuff Size: Normal)   Pulse 61   Temp 98.1 F (36.7 C) (Oral)   Resp 16   Ht 5\' 3"  (1.6 m)   Wt 198 lb 8 oz (90 kg)  SpO2 99%   BMI 35.16 kg/m  General:   Well developed, NAD, BMI noted. HEENT:  Normocephalic . Face symmetric, atraumatic Lungs:  CTA B Normal respiratory effort, no intercostal retractions, no accessory muscle use. Heart: RRR,  no murmur.  Skin: Multiple varicose veins noted on the lower extremities MSK:  elbows normal to inspection and palpation DM foot exam: No edema, normal pinprick exam, good pedal pulses. Neurologic:  alert & oriented X3.  Speech normal, gait appropriate for age and unassisted Psych--  Cognition and judgment appear intact.  Cooperative with normal attention span and concentration.  Behavior appropriate. No anxious or depressed appearing.      Assessment     Assessment DM:  A1c 5.9 2016; A1C 6.30 August 2018 Hyperlipidemia  Menopausal, 2019  Plan: DM: We again discussed diet, exercise; check a A1c and micro.  Reports she had an eye exam few months ago. Foot exam (-) today High cholesterol: Currently taking Lipitor, checking a FLP, AST,  ALT Elbow B & upper back pain: The patient works on her feet 8 to 10 hours daily, she reports uses her arms a lot.  Believe her pain is overuse, recommend stretching and  changing positions frequently. Varicose veins: Previously I advised her to use  compression stockings, when she does, she feels better, encouraged to continue using them Preventive care: PNM 23 today. Menopausal LMP > 1 year RTC 6 months

## 2019-01-06 NOTE — Patient Instructions (Addendum)
feliz cumpleaos atrasado!  Per our records you are due for an eye exam. Please contact your eye doctor to schedule an appointment. Please have them send copies of your office visit notes to [place]. Our fax number is [phone].   GO TO THE LAB : Get the blood work     GO TO THE FRONT DESK Schedule your next appointment   CHECK UP IN 6 MONTHS      REGRESE EN 6 MESES    SIGA CON UNA DIETA SALUDALBE  HAGA EJERCICIOS REGULARMENTE

## 2019-01-06 NOTE — Progress Notes (Signed)
Pre visit review using our clinic review tool, if applicable. No additional management support is needed unless otherwise documented below in the visit note. 

## 2019-07-07 ENCOUNTER — Ambulatory Visit: Payer: BLUE CROSS/BLUE SHIELD | Admitting: Internal Medicine

## 2019-07-07 DIAGNOSIS — Z0289 Encounter for other administrative examinations: Secondary | ICD-10-CM

## 2019-09-24 DIAGNOSIS — Z23 Encounter for immunization: Secondary | ICD-10-CM | POA: Diagnosis not present

## 2019-11-18 ENCOUNTER — Encounter: Payer: Self-pay | Admitting: Internal Medicine

## 2019-11-18 ENCOUNTER — Ambulatory Visit (INDEPENDENT_AMBULATORY_CARE_PROVIDER_SITE_OTHER): Payer: BC Managed Care – PPO | Admitting: Internal Medicine

## 2019-11-18 ENCOUNTER — Other Ambulatory Visit: Payer: Self-pay

## 2019-11-18 VITALS — BP 129/78 | HR 77 | Temp 97.1°F | Resp 16 | Ht 62.5 in | Wt 187.0 lb

## 2019-11-18 DIAGNOSIS — E785 Hyperlipidemia, unspecified: Secondary | ICD-10-CM

## 2019-11-18 DIAGNOSIS — E119 Type 2 diabetes mellitus without complications: Secondary | ICD-10-CM

## 2019-11-18 DIAGNOSIS — R238 Other skin changes: Secondary | ICD-10-CM | POA: Diagnosis not present

## 2019-11-18 DIAGNOSIS — M25562 Pain in left knee: Secondary | ICD-10-CM

## 2019-11-18 DIAGNOSIS — R233 Spontaneous ecchymoses: Secondary | ICD-10-CM

## 2019-11-18 DIAGNOSIS — E7800 Pure hypercholesterolemia, unspecified: Secondary | ICD-10-CM

## 2019-11-18 DIAGNOSIS — M25561 Pain in right knee: Secondary | ICD-10-CM

## 2019-11-18 LAB — CBC WITH DIFFERENTIAL/PLATELET
Basophils Absolute: 0.1 10*3/uL (ref 0.0–0.1)
Basophils Relative: 0.8 % (ref 0.0–3.0)
Eosinophils Absolute: 0.1 10*3/uL (ref 0.0–0.7)
Eosinophils Relative: 1.9 % (ref 0.0–5.0)
HCT: 38.7 % (ref 36.0–46.0)
Hemoglobin: 12.5 g/dL (ref 12.0–15.0)
Lymphocytes Relative: 44.1 % (ref 12.0–46.0)
Lymphs Abs: 3 10*3/uL (ref 0.7–4.0)
MCHC: 32.3 g/dL (ref 30.0–36.0)
MCV: 89.2 fl (ref 78.0–100.0)
Monocytes Absolute: 0.4 10*3/uL (ref 0.1–1.0)
Monocytes Relative: 6.3 % (ref 3.0–12.0)
Neutro Abs: 3.2 10*3/uL (ref 1.4–7.7)
Neutrophils Relative %: 46.9 % (ref 43.0–77.0)
Platelets: 182 10*3/uL (ref 150.0–400.0)
RBC: 4.34 Mil/uL (ref 3.87–5.11)
RDW: 13.8 % (ref 11.5–15.5)
WBC: 6.8 10*3/uL (ref 4.0–10.5)

## 2019-11-18 LAB — MICROALBUMIN / CREATININE URINE RATIO
Creatinine,U: 151.7 mg/dL
Microalb Creat Ratio: 0.7 mg/g (ref 0.0–30.0)
Microalb, Ur: 1 mg/dL (ref 0.0–1.9)

## 2019-11-18 LAB — BASIC METABOLIC PANEL
BUN: 15 mg/dL (ref 6–23)
CO2: 29 mEq/L (ref 19–32)
Calcium: 9.3 mg/dL (ref 8.4–10.5)
Chloride: 104 mEq/L (ref 96–112)
Creatinine, Ser: 0.67 mg/dL (ref 0.40–1.20)
GFR: 93.58 mL/min (ref >60.00)
Glucose, Bld: 106 mg/dL — ABNORMAL HIGH (ref 70–99)
Potassium: 3.8 mEq/L (ref 3.5–5.1)
Sodium: 140 mEq/L (ref 135–145)

## 2019-11-18 LAB — HEMOGLOBIN A1C: Hgb A1c MFr Bld: 6.2 % (ref 4.6–6.5)

## 2019-11-18 MED ORDER — ATORVASTATIN CALCIUM 10 MG PO TABS: 10.0000 mg | ORAL_TABLET | Freq: Every day | ORAL | 1 refills | Status: DC

## 2019-11-18 NOTE — Progress Notes (Signed)
Subjective:    Patient ID: Amy Davidson, female    DOB: 1971/06/30, 48 y.o.   MRN: [id number]  DOS:  11/18/2019 Type of visit - description: Routine visit DM: Due for A1c. High cholesterol: Took lipitor temporarily, she did not realize it was a permanent medication.  No side effects. 5 months history of left knee pain: Without swelling, mostly at the front of the knee, worse when she starts walking, once she warms up is better.  She works on her feet for long hours every day. Complains of easy bruising, mostly at the upper extremities.  Review of Systems Denies chest pain difficulty breathing No blood in the urine or blood in the stools. No bleeding when she brushes her teeth  Past Medical History:  Diagnosis Date  . Contraception    none  . Diabetes mellitus without complication (HCC) 10/30/2018    Past Surgical History:  Procedure Laterality Date  . CESAREAN SECTION  06/23/2011   Procedure: CESAREAN SECTION;  Surgeon: Leighton Roach Meisinger;  Location: [facility];  Service: Gynecology;  Laterality: N/A;  Primary cesarean section with delivery of baby boy at 35. Apgars 8/9.    Social History   Socioeconomic History  . Marital status: Married    Spouse name: Not on file  . Number of children: 5  . Years of education: Not on file  . Highest education level: Not on file  Occupational History  . Occupation: factory job  Tobacco Use  . Smoking status: Never Smoker  . Smokeless tobacco: Never Used  Substance and Sexual Activity  . Alcohol use: No    Alcohol/week: 0.0 standard drinks  . Drug use: No  . Sexual activity: Not on file  Other Topics Concern  . Not on file  Social History Narrative   Married, lives w/ husband and 5 children, from [place]   Oldest born 2000   Household: pt, husband, 5 children    Social Determinants of Health   Financial Resource Strain:   . Difficulty of Paying Living Expenses: Not on file  Food Insecurity:   . Worried About Patent examiner in the Last Year: Not on file  . Ran Out of Food in the Last Year: Not on file  Transportation Needs:   . Lack of Transportation (Medical): Not on file  . Lack of Transportation (Non-Medical): Not on file  Physical Activity:   . Days of Exercise per Week: Not on file  . Minutes of Exercise per Session: Not on file  Stress:   . Feeling of Stress : Not on file  Social Connections:   . Frequency of Communication with Friends and Family: Not on file  . Frequency of Social Gatherings with Friends and Family: Not on file  . Attends Religious Services: Not on file  . Active Member of Clubs or Organizations: Not on file  . Attends Banker Meetings: Not on file  . Marital Status: Not on file  Intimate Partner Violence:   . Fear of Current or Ex-Partner: Not on file  . Emotionally Abused: Not on file  . Physically Abused: Not on file  . Sexually Abused: Not on file      Allergies as of 11/18/2019   No Known Allergies     Medication List       Accurate as of November 18, 2019 10:38 AM. If you have any questions, ask your nurse or doctor.        atorvastatin 10 MG  tablet Commonly known as: LIPITOR Take 1 tablet (10 mg total) by mouth daily at 6 PM.           Objective:   Physical Exam BP 129/78 (BP Location: Left Arm, Patient Position: Sitting, Cuff Size: Large)   Pulse 77   Temp (!) 97.1 F (36.2 C) (Temporal)   Resp 16   Ht 5' 2.5" (1.588 m)   Wt 187 lb (84.8 kg)   LMP 03/25/2017 (Approximate)   SpO2 100%   BMI 33.66 kg/m   General:   Well developed, NAD, BMI noted.  HEENT:  Normocephalic . Face symmetric, atraumatic Lungs:  CTA B Normal respiratory effort, no intercostal retractions, no accessory muscle use. Heart: RRR,  no murmur.  no pretibial edema bilaterally  Abdomen:  Not distended, soft, non-tender. No rebound or rigidity.   MSK: Knees: Without deformities, effusion, range of motion normal.  Symmetric Skin: Not pale. Not  jaundice Neurologic:  alert & oriented X3.  Speech normal, gait appropriate for age and unassisted Psych--  Cognition and judgment appear intact.  Cooperative with normal attention span and concentration.  Behavior appropriate. No anxious or depressed appearing.     Assessment    Assessment DM:  A1c 5.9 2016; A1C 6.30 August 2018 Hyperlipidemia  Menopausal, 2019  PLAN DM: Diet controlled.  Check a BMP, A1c and micro.  Healthy diet and exercise encouraged Diabetes foot negative.  She denies lower extremity paresthesias Hyperlipidemia: Took Lipitor temporarily, no side effects, LFTs subsequently normal.  She just stopped, did not know it was a permanent medication, RF Lipitor and recheck when she comes back. Easy bruising: Check a CBC Knee pain: Exam is benign, he works on her feet for hours every day.  Recommend a knee brace and judicious use of Tylenol and ice.  If not better consider referral Preventive care reviewed Had a flu shot, to see gynecology and  have a mammogram next month. Early CCS discussed briefly, will talk more about it when she comes back for a physical RTC CPX 4 months   This visit occurred during the SARS-CoV-2 public health emergency.  Safety protocols were in place, including screening questions prior to the visit, additional usage of staff PPE, and extensive cleaning of exam room while observing appropriate contact time as indicated for disinfecting solutions.

## 2019-11-18 NOTE — Patient Instructions (Signed)
GO TO THE LAB : Get the blood work     GO TO THE FRONT DESK Schedule your next appointment for a physical exam in 4 months   PARA L[place] RODILLAS: PONGASE UN BRACE HIELO DESPUES DEL TRABAJO TOME TYLENOL 500MH : 2 PASTILLAS AL DIA SI LO [place] LOS DIAS

## 2019-11-20 NOTE — Assessment & Plan Note (Signed)
DM: Diet controlled.  Check a BMP, A1c and micro.  Healthy diet and exercise encouraged Diabetes foot negative.  She denies lower extremity paresthesias Hyperlipidemia: Took Lipitor temporarily, no side effects, LFTs subsequently normal.  She just stopped, did not know it was a permanent medication, RF Lipitor and recheck when she comes back. Easy bruising: Check a CBC Knee pain: Exam is benign, he works on her feet for hours every day.  Recommend a knee brace and judicious use of Tylenol and ice.  If not better consider referral Preventive care reviewed Had a flu shot, to see gynecology and  have a mammogram next month. Early CCS discussed briefly, will talk more about it when she comes back for a physical RTC CPX 4 months

## 2019-11-22 NOTE — Progress Notes (Signed)
Letter mailed to patient.

## 2020-03-20 ENCOUNTER — Encounter: Payer: Self-pay | Admitting: Internal Medicine

## 2020-03-20 ENCOUNTER — Ambulatory Visit (INDEPENDENT_AMBULATORY_CARE_PROVIDER_SITE_OTHER): Payer: BC Managed Care – PPO | Admitting: Internal Medicine

## 2020-03-20 ENCOUNTER — Other Ambulatory Visit: Payer: Self-pay

## 2020-03-20 ENCOUNTER — Ambulatory Visit (HOSPITAL_BASED_OUTPATIENT_CLINIC_OR_DEPARTMENT_OTHER)
Admission: RE | Admit: 2020-03-20 | Discharge: 2020-03-20 | Disposition: A | Discharge status: Home / Self Care | Payer: BC Managed Care – PPO | Source: Ambulatory Visit | Attending: Internal Medicine | Admitting: Internal Medicine

## 2020-03-20 VITALS — BP 134/73 | HR 82 | Temp 96.9°F | Resp 18 | Ht 63.0 in | Wt 190.1 lb

## 2020-03-20 DIAGNOSIS — E785 Hyperlipidemia, unspecified: Secondary | ICD-10-CM

## 2020-03-20 DIAGNOSIS — R011 Cardiac murmur, unspecified: Secondary | ICD-10-CM

## 2020-03-20 DIAGNOSIS — Z0000 Encounter for general adult medical examination without abnormal findings: Secondary | ICD-10-CM | POA: Diagnosis not present

## 2020-03-20 DIAGNOSIS — R079 Chest pain, unspecified: Secondary | ICD-10-CM

## 2020-03-20 DIAGNOSIS — Z8616 Personal history of COVID-19: Secondary | ICD-10-CM

## 2020-03-20 DIAGNOSIS — E119 Type 2 diabetes mellitus without complications: Secondary | ICD-10-CM

## 2020-03-20 LAB — COMPREHENSIVE METABOLIC PANEL
ALT: 16 U/L (ref 0–35)
AST: 17 U/L (ref 0–37)
Albumin: 4.4 g/dL (ref 3.5–5.2)
Alkaline Phosphatase: 70 U/L (ref 39–117)
BUN: 12 mg/dL (ref 6–23)
CO2: 29 mEq/L (ref 19–32)
Calcium: 9 mg/dL (ref 8.4–10.5)
Chloride: 105 mEq/L (ref 96–112)
Creatinine, Ser: 0.63 mg/dL (ref 0.40–1.20)
GFR: 100.33 mL/min (ref >60.00)
Glucose, Bld: 106 mg/dL — ABNORMAL HIGH (ref 70–99)
Potassium: 3.7 mEq/L (ref 3.5–5.1)
Sodium: 141 mEq/L (ref 135–145)
Total Bilirubin: 0.4 mg/dL (ref 0.2–1.2)
Total Protein: 7 g/dL (ref 6.0–8.3)

## 2020-03-20 LAB — CBC WITH DIFFERENTIAL/PLATELET
Basophils Absolute: 0 10*3/uL (ref 0.0–0.1)
Basophils Relative: 0.9 % (ref 0.0–3.0)
Eosinophils Absolute: 0.1 10*3/uL (ref 0.0–0.7)
Eosinophils Relative: 1.9 % (ref 0.0–5.0)
HCT: 36.1 % (ref 36.0–46.0)
Hemoglobin: 11.8 g/dL — ABNORMAL LOW (ref 12.0–15.0)
Lymphocytes Relative: 48.4 % — ABNORMAL HIGH (ref 12.0–46.0)
Lymphs Abs: 2.4 10*3/uL (ref 0.7–4.0)
MCHC: 32.6 g/dL (ref 30.0–36.0)
MCV: 89.2 fl (ref 78.0–100.0)
Monocytes Absolute: 0.2 10*3/uL (ref 0.1–1.0)
Monocytes Relative: 5.1 % (ref 3.0–12.0)
Neutro Abs: 2.1 10*3/uL (ref 1.4–7.7)
Neutrophils Relative %: 43.7 % (ref 43.0–77.0)
Platelets: 181 10*3/uL (ref 150.0–400.0)
RBC: 4.05 Mil/uL (ref 3.87–5.11)
RDW: 15.6 % — ABNORMAL HIGH (ref 11.5–15.5)
WBC: 4.9 10*3/uL (ref 4.0–10.5)

## 2020-03-20 LAB — LIPID PANEL
Cholesterol: 147 mg/dL (ref 0–200)
HDL: 47.7 mg/dL (ref >39.00)
LDL Cholesterol: 84 mg/dL (ref 0–99)
NonHDL: 99.25
Total CHOL/HDL Ratio: 3
Triglycerides: 77 mg/dL (ref 0.0–149.0)
VLDL: 15.4 mg/dL (ref 0.0–40.0)

## 2020-03-20 LAB — HEMOGLOBIN A1C: Hgb A1c MFr Bld: 6.2 % (ref 4.6–6.5)

## 2020-03-20 NOTE — Patient Instructions (Addendum)
Per our records you are due for an eye exam. Please contact your eye doctor to schedule an appointment. Please have them send copies of your office visit notes to [place]. Our fax number is [phone].   SAQUESE LA SANGRE  NO TOME ATORVASTATIN HASTA QUE REGRESE  SAQUE UNA CITA PARA VERME EN UN MES  TOMESE LOS RAYOS x EN EL PRIMER PISO    SI TIENE DOLORES SEVEROS ME AVISA   VAMOS A TOMARLE UN ECOCARDIOGRAMA DEL CORAZON

## 2020-03-20 NOTE — Progress Notes (Signed)
Pre visit review using our clinic review tool, if applicable. No additional management support is needed unless otherwise documented below in the visit note. 

## 2020-03-20 NOTE — Progress Notes (Signed)
Subjective:    Patient ID: Amy Davidson, female    DOB: [birth year], 49 y.o.   MRN: [id number]  DOS:  03/20/2020 Type of visit - description: Here for CPX  Since the last office visit, she was diagnosed with Covid last month. Overall feels better, she has a couple of residual symptoms:  Before Covid she had midthoracic back pain bilaterally without radiation, since then symptoms have increased.  Also has left upper chest discomfort, not associated with shortness of breath. No worse with cough.  Worse with certain torso movements. Denies any breast tenderness or breast discomfort  Additionally, when she was diagnosed with Covid she was told she has a heart murmur.   Review of Systems At this point denies fever, chills, cough, nausea, vomiting, diarrhea.  Other than above, a 14 point review of systems is negative      Past Medical History:  Diagnosis Date  . Contraception    none  . Diabetes mellitus without complication ([place]) 04/28/159    Past Surgical History:  Procedure Laterality Date  . CESAREAN SECTION  06/23/2011   Procedure: CESAREAN SECTION;  Surgeon: Blane Ohara Meisinger;  Location: [facility];  Service: Gynecology;  Laterality: N/A;  Primary cesarean section with delivery of baby boy at 7. Apgars 8/9.   Family History  Problem Relation Age of Onset  . Diabetes Mother   . Breast cancer Cousin   . CAD Neg Hx   . Colon cancer Neg Hx      Allergies as of 03/20/2020   No Known Allergies     Medication List       Accurate as of March 20, 2020 11:59 PM. If you have any questions, ask your nurse or doctor.        atorvastatin 10 MG tablet Commonly known as: LIPITOR Take 1 tablet (10 mg total) by mouth daily at 6 PM.          Objective:   Physical Exam BP 134/73 (BP Location: Left Arm, Patient Position: Sitting, Cuff Size: Normal)   Pulse 82   Temp (!) 96.9 F (36.1 C) (Temporal)   Resp 18   Ht 5\' 3"  (1.6 m)   Wt 190 lb 2 oz (86.2 kg)   LMP  03/25/2017 (Approximate)   SpO2 100%   BMI 33.68 kg/m  General: Well developed, NAD, BMI noted Neck: No  thyromegaly  HEENT:  Normocephalic . Face symmetric, atraumatic Lungs:  CTA B Normal respiratory effort, no intercostal retractions, no accessory muscle use. Heart: RRR, + systolic murmur, better heard  left from the sternum.  Abdomen:  Not distended, soft, non-tender. No rebound or rigidity.   Lower extremities: no pretibial edema bilaterally  MSK: No TTP at the thoracic spine. Mild tenderness at the left upper chest wall? Skin: Exposed areas without rash. Not pale. Not jaundice Neurologic:  alert & oriented X3.  Speech normal, gait appropriate for age and unassisted Strength symmetric and appropriate for age.  Psych: Cognition and judgment appear intact.  Cooperative with normal attention span and concentration.  Behavior appropriate. No anxious or depressed appearing.     Assessment      Assessment DM:  A1c 5.9 2016; A1C 6.30 August 2018 Hyperlipidemia  Menopausal, 2019 COVID-19 >>> 01/2020  PLAN Here for CPX, multiple other issues discussed DM: Diet controlled, check A1c Hyperlipidemia: On Lipitor, has developed a couple of MSK complaints (thoracic pain, chest pain), related to Lipitor?Marland Kitchen  Hold Lipitor for 4 weeks, RTC 4 weeks Heart murmur:  New finding EKG today: NSR, no acute change. We will get a echo Thoracic pain, left-sided chest pain: Thoracic pain worse since Covid last month, left-sided chest pain new since Covid. EKG done today, holding Lipitor, get a chest x-ray. Instructions discussed in Spanish RTC 4 weeks.  In addition to CPX, we also discussed DM, high cholesterol, and new finding of heart murmur, thoracic pain.   This visit occurred during the SARS-CoV-2 public health emergency.  Safety protocols were in place, including screening questions prior to the visit, additional usage of staff PPE, and extensive cleaning of exam room while observing  appropriate contact time as indicated for disinfecting solutions.

## 2020-03-21 NOTE — Assessment & Plan Note (Signed)
Here for CPX, multiple other issues discussed DM: Diet controlled, check A1c Hyperlipidemia: On Lipitor, has developed a couple of MSK complaints (thoracic pain, chest pain), related to Lipitor?Amy Davidson  Hold Lipitor for 4 weeks, RTC 4 weeks Heart murmur: New finding EKG today: NSR, no acute change. We will get a echo Thoracic pain, left-sided chest pain: Thoracic pain worse since Covid last month, left-sided chest pain new since Covid. EKG done today, holding Lipitor, get a chest x-ray. Instructions discussed in Spanish RTC 4 weeks.

## 2020-03-21 NOTE — Assessment & Plan Note (Signed)
Td 2012.  PNM 23 12-2018 Covid vaccination recommended within the next few weeks Female care:  Pap smear 09-2018 per [facility];  MMG 11-2018: Negative Menopausal 2019: Normal Vid  D  per labs 2018, rec OTC vitamin D, consider DEXA 2024. CCS: 3 modalities discussed, I fob provided Labs:  CMP, FLP, CBC, A1c, chest x-ray,iFOB  Diet and exercise: Discussed

## 2020-03-25 LAB — HM DIABETES EYE EXAM

## 2020-04-11 ENCOUNTER — Other Ambulatory Visit: Payer: Self-pay

## 2020-04-11 ENCOUNTER — Ambulatory Visit (HOSPITAL_COMMUNITY): Payer: BC Managed Care – PPO | Attending: Cardiovascular Disease

## 2020-04-11 DIAGNOSIS — R079 Chest pain, unspecified: Secondary | ICD-10-CM | POA: Diagnosis not present

## 2020-04-11 DIAGNOSIS — Z8616 Personal history of COVID-19: Secondary | ICD-10-CM

## 2020-04-11 DIAGNOSIS — R011 Cardiac murmur, unspecified: Secondary | ICD-10-CM | POA: Insufficient documentation

## 2020-04-17 ENCOUNTER — Encounter: Payer: Self-pay | Admitting: Internal Medicine

## 2020-04-17 ENCOUNTER — Other Ambulatory Visit: Payer: Self-pay

## 2020-04-17 ENCOUNTER — Ambulatory Visit (HOSPITAL_BASED_OUTPATIENT_CLINIC_OR_DEPARTMENT_OTHER)
Admission: RE | Admit: 2020-04-17 | Discharge: 2020-04-17 | Disposition: A | Discharge status: Home / Self Care | Payer: BC Managed Care – PPO | Source: Ambulatory Visit | Attending: Internal Medicine | Admitting: Internal Medicine

## 2020-04-17 ENCOUNTER — Ambulatory Visit (INDEPENDENT_AMBULATORY_CARE_PROVIDER_SITE_OTHER): Payer: BC Managed Care – PPO | Admitting: Internal Medicine

## 2020-04-17 VITALS — BP 137/82 | HR 74 | Temp 97.3°F | Resp 16 | Ht 63.0 in | Wt 184.4 lb

## 2020-04-17 DIAGNOSIS — M546 Pain in thoracic spine: Secondary | ICD-10-CM | POA: Insufficient documentation

## 2020-04-17 DIAGNOSIS — E785 Hyperlipidemia, unspecified: Secondary | ICD-10-CM

## 2020-04-17 DIAGNOSIS — R011 Cardiac murmur, unspecified: Secondary | ICD-10-CM

## 2020-04-17 DIAGNOSIS — M47814 Spondylosis without myelopathy or radiculopathy, thoracic region: Secondary | ICD-10-CM | POA: Diagnosis not present

## 2020-04-17 DIAGNOSIS — D649 Anemia, unspecified: Secondary | ICD-10-CM | POA: Diagnosis not present

## 2020-04-17 NOTE — Progress Notes (Signed)
Pre visit review using our clinic review tool, if applicable. No additional management support is needed unless otherwise documented below in the visit note. 

## 2020-04-17 NOTE — Progress Notes (Signed)
Subjective:    Patient ID: Amy Davidson, female    DOB: 01/31/1971, 49 y.o.   MRN: [id number]  DOS:  04/17/2020 Type of visit - description: Follow-up from previous visit Today with talk about the thoracic and chest pain as well as her echocardiogram results. Still has thoracic pain    Review of Systems Denies nausea, vomiting, diarrhea or blood in the stools. Patient is menopausal, no vaginal bleeding  Past Medical History:  Diagnosis Date  . Contraception    none  . Diabetes mellitus without complication (HCC) 10/30/2018    Past Surgical History:  Procedure Laterality Date  . CESAREAN SECTION  06/23/2011   Procedure: CESAREAN SECTION;  Surgeon: Leighton Roach Meisinger;  Location: [facility];  Service: Gynecology;  Laterality: N/A;  Primary cesarean section with delivery of baby boy at 55. Apgars 8/9.    Allergies as of 04/17/2020   No Known Allergies     Medication List       Accurate as of Apr 17, 2020  4:16 PM. If you have any questions, ask your nurse or doctor.        atorvastatin 10 MG tablet Commonly known as: LIPITOR Take 1 tablet (10 mg total) by mouth daily at 6 PM.          Objective:   Physical Exam Musculoskeletal:       Arms:    BP 137/82 (BP Location: Left Arm, Patient Position: Sitting, Cuff Size: Normal)   Pulse 74   Temp (!) 97.3 F (36.3 C) (Temporal)   Resp 16   Ht 5\' 3"  (1.6 m)   Wt 184 lb 6 oz (83.6 kg)   LMP 03/25/2017 (Approximate)   SpO2 100%   BMI 32.66 kg/m  General:   Well developed, NAD, BMI noted. HEENT:  Normocephalic . Face symmetric, atraumatic Lungs:  CTA B Normal respiratory effort, no intercostal retractions, no accessory muscle use. Heart: RRR, +, mild systolic murmur.  Lower extremities: no pretibial edema bilaterally MSK: No TTP of the thoracic spine Skin: Not pale. Not jaundice Neurologic:  alert & oriented X3.  Speech normal, gait appropriate for age and unassisted Psych--  Cognition and judgment appear  intact.  Cooperative with normal attention span and concentration.  Behavior appropriate. No anxious or depressed appearing.      Assessment      Assessment DM:  A1c 5.9 2016; A1C 6.30 August 2018 Hyperlipidemia  Menopausal, 2019 COVID-19 >>> 01/2020  PLAN See last visit Hyperlipidemia: Stopping Lipitor did not help her thoracic pain.  Restart Lipitor Heart murmur: I am still able to hear a mild heart murmur, echo negative.  Patient is very concerned about this finding, will refer to cardiology. Thoracic pain, left-sided chest pain: Left-sided chest pain solved. Thoracic pain: Worse with certain position particularly at night.  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  Suspect MSK, chest x-ray was negative I will get that dedicated thoracic spine x-ray. Refer to physical therapy. Mild anemia: Last hemoglobin is slightly low, GI/ GU ROS negative.  Our lab is closed at the time of this visit, will recheck labs on return to the office. RTC 3 months   This visit occurred during the SARS-CoV-2 public health emergency.  Safety protocols were in place, including screening questions prior to the visit, additional usage of staff PPE, and extensive cleaning of exam room while observing appropriate contact time as indicated for disinfecting solutions.

## 2020-04-17 NOTE — Patient Instructions (Addendum)
  GO TO THE FRONT DESK, PLEASE SCHEDULE YOUR APPOINTMENTS Come back for   checkup in 3 months   PARE EN EL PRIMER PISO PARA TOMARSE UNA RADIOGRAFIA  TOME DE NUEVO ATORVASTATIN  LA ESTAMOS REFIRIENDO A UN PHYSICAL THERAPIST  REGRESE EN 3 MESES

## 2020-04-18 NOTE — Assessment & Plan Note (Signed)
See last visit Hyperlipidemia: Stopping Lipitor did not help her thoracic pain.  Restart Lipitor Heart murmur: I am still able to hear a mild heart murmur, echo negative.  Patient is very concerned about this finding, will refer to cardiology. Thoracic pain, left-sided chest pain: Left-sided chest pain solved. Thoracic pain: Worse with certain position particularly at night.  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  Suspect MSK, chest x-ray was negative I will get that dedicated thoracic spine x-ray. Refer to physical therapy. Mild anemia: Last hemoglobin is slightly low, GI/ GU ROS negative.  Our lab is closed at the time of this visit, will recheck labs on return to the office. RTC 3 months

## 2020-04-27 ENCOUNTER — Telehealth: Payer: Self-pay | Admitting: Cardiovascular Disease

## 2020-04-27 NOTE — Telephone Encounter (Signed)
I called patient 04/27/20 to schedule initial visit due to the fact the patient was referred over to Dr. Duke Salvia from patient's primary care physician. The patient was not home but there was message left for patient to call back in order to get scheduled.

## 2020-05-05 ENCOUNTER — Encounter: Payer: Self-pay | Admitting: General Practice

## 2020-06-07 ENCOUNTER — Encounter: Payer: Self-pay | Admitting: Internal Medicine

## 2020-06-28 DIAGNOSIS — Z130 Encounter for screening for diseases of the blood and blood-forming organs and certain disorders involving the immune mechanism: Secondary | ICD-10-CM | POA: Diagnosis not present

## 2020-06-28 DIAGNOSIS — Z6833 Body mass index (BMI) 33.0-33.9, adult: Secondary | ICD-10-CM | POA: Diagnosis not present

## 2020-06-28 DIAGNOSIS — Z124 Encounter for screening for malignant neoplasm of cervix: Secondary | ICD-10-CM | POA: Diagnosis not present

## 2020-06-28 DIAGNOSIS — Z1151 Encounter for screening for human papillomavirus (HPV): Secondary | ICD-10-CM | POA: Diagnosis not present

## 2020-06-28 DIAGNOSIS — Z01419 Encounter for gynecological examination (general) (routine) without abnormal findings: Secondary | ICD-10-CM | POA: Diagnosis not present

## 2020-06-29 DIAGNOSIS — Z124 Encounter for screening for malignant neoplasm of cervix: Secondary | ICD-10-CM | POA: Diagnosis not present

## 2020-06-29 DIAGNOSIS — Z1151 Encounter for screening for human papillomavirus (HPV): Secondary | ICD-10-CM | POA: Diagnosis not present

## 2020-06-29 LAB — RESULTS CONSOLE HPV: CHL HPV: NEGATIVE

## 2020-06-29 LAB — HM PAP SMEAR: HM Pap smear: NEGATIVE

## 2020-07-10 DIAGNOSIS — Z1231 Encounter for screening mammogram for malignant neoplasm of breast: Secondary | ICD-10-CM | POA: Diagnosis not present

## 2020-07-10 LAB — HM MAMMOGRAPHY

## 2020-07-18 ENCOUNTER — Ambulatory Visit: Payer: BC Managed Care – PPO | Admitting: Internal Medicine

## 2020-07-21 ENCOUNTER — Other Ambulatory Visit: Payer: Self-pay | Admitting: Obstetrics and Gynecology

## 2020-07-21 DIAGNOSIS — R928 Other abnormal and inconclusive findings on diagnostic imaging of breast: Secondary | ICD-10-CM

## 2020-07-28 ENCOUNTER — Encounter: Payer: Self-pay | Admitting: Internal Medicine

## 2020-08-09 ENCOUNTER — Other Ambulatory Visit: Payer: Self-pay | Admitting: Obstetrics and Gynecology

## 2020-08-09 ENCOUNTER — Other Ambulatory Visit: Payer: Self-pay

## 2020-08-09 ENCOUNTER — Ambulatory Visit
Admission: RE | Admit: 2020-08-09 | Discharge: 2020-08-09 | Disposition: A | Discharge status: Home / Self Care | Payer: BC Managed Care – PPO | Source: Ambulatory Visit | Attending: Obstetrics and Gynecology | Admitting: Obstetrics and Gynecology

## 2020-08-09 DIAGNOSIS — R928 Other abnormal and inconclusive findings on diagnostic imaging of breast: Secondary | ICD-10-CM | POA: Diagnosis not present

## 2020-08-09 DIAGNOSIS — N6323 Unspecified lump in the left breast, lower outer quadrant: Secondary | ICD-10-CM | POA: Diagnosis not present

## 2020-09-17 IMAGING — DX DG THORACIC SPINE 3V
3 series · 3 of 3 positions shown · non-contrast
Comparison: 03/20/2020

CLINICAL DATA: Acute back pain

EXAM:
THORACIC SPINE - 3 VIEWS

[t-spine ap]
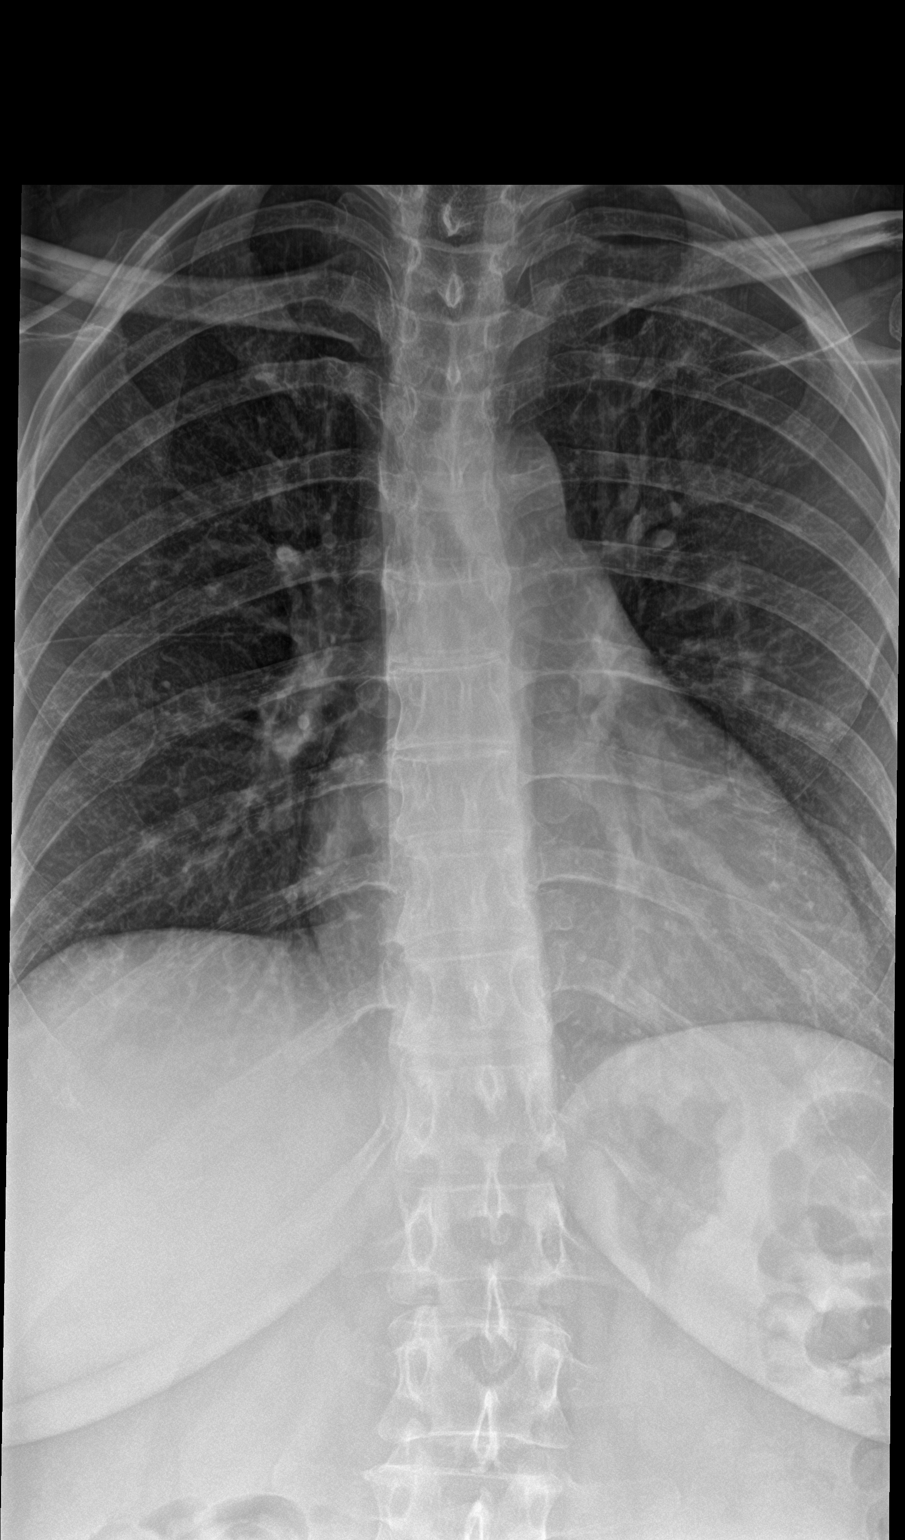

[t-spine swimmers]
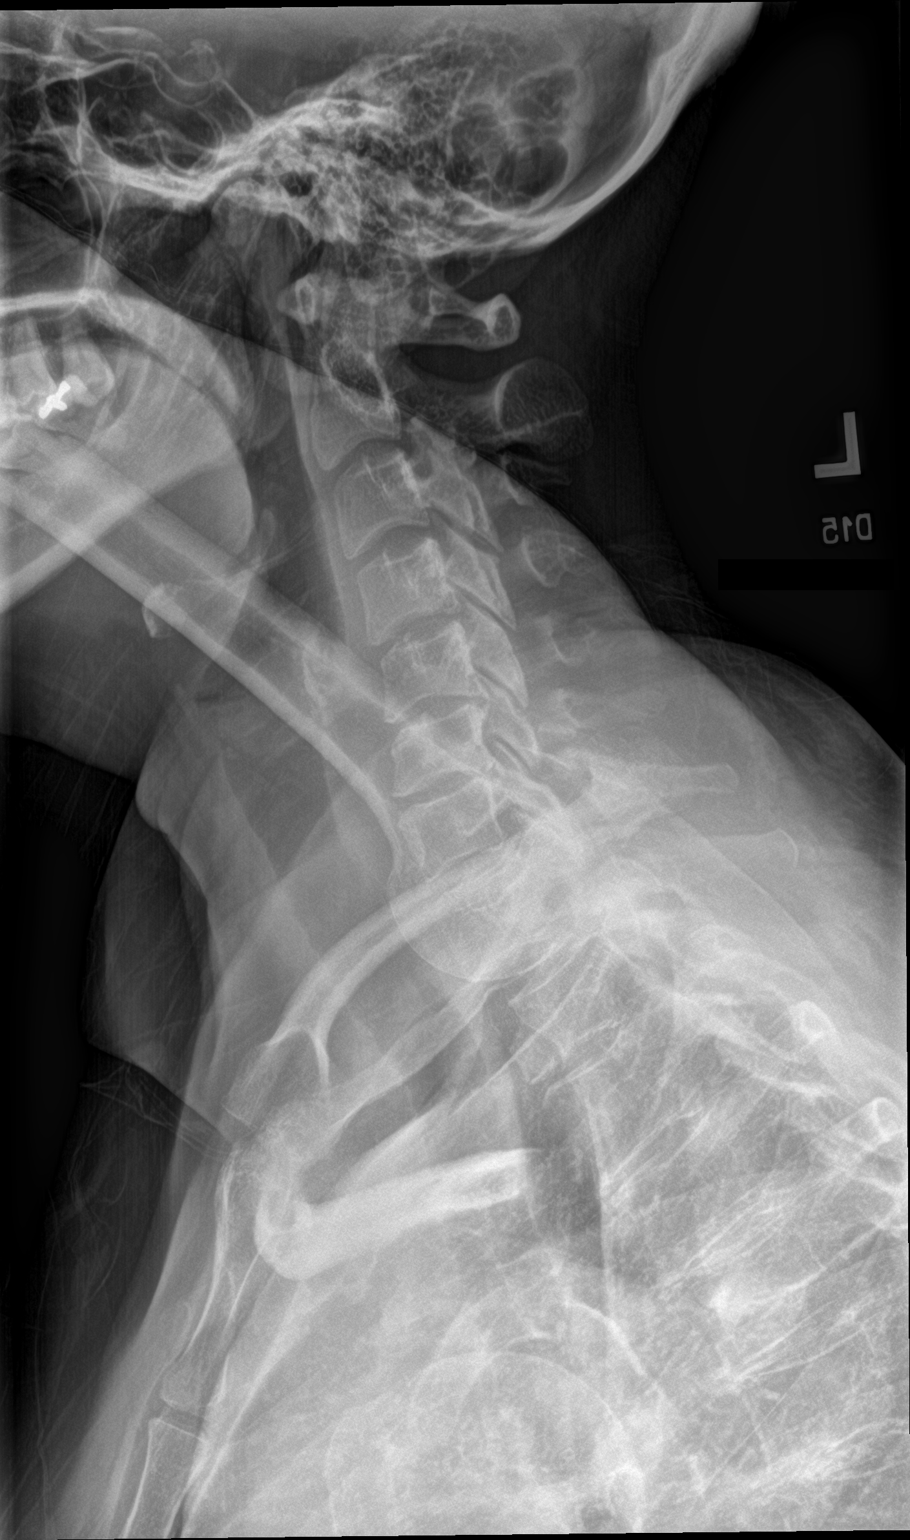

[t-spine lat]
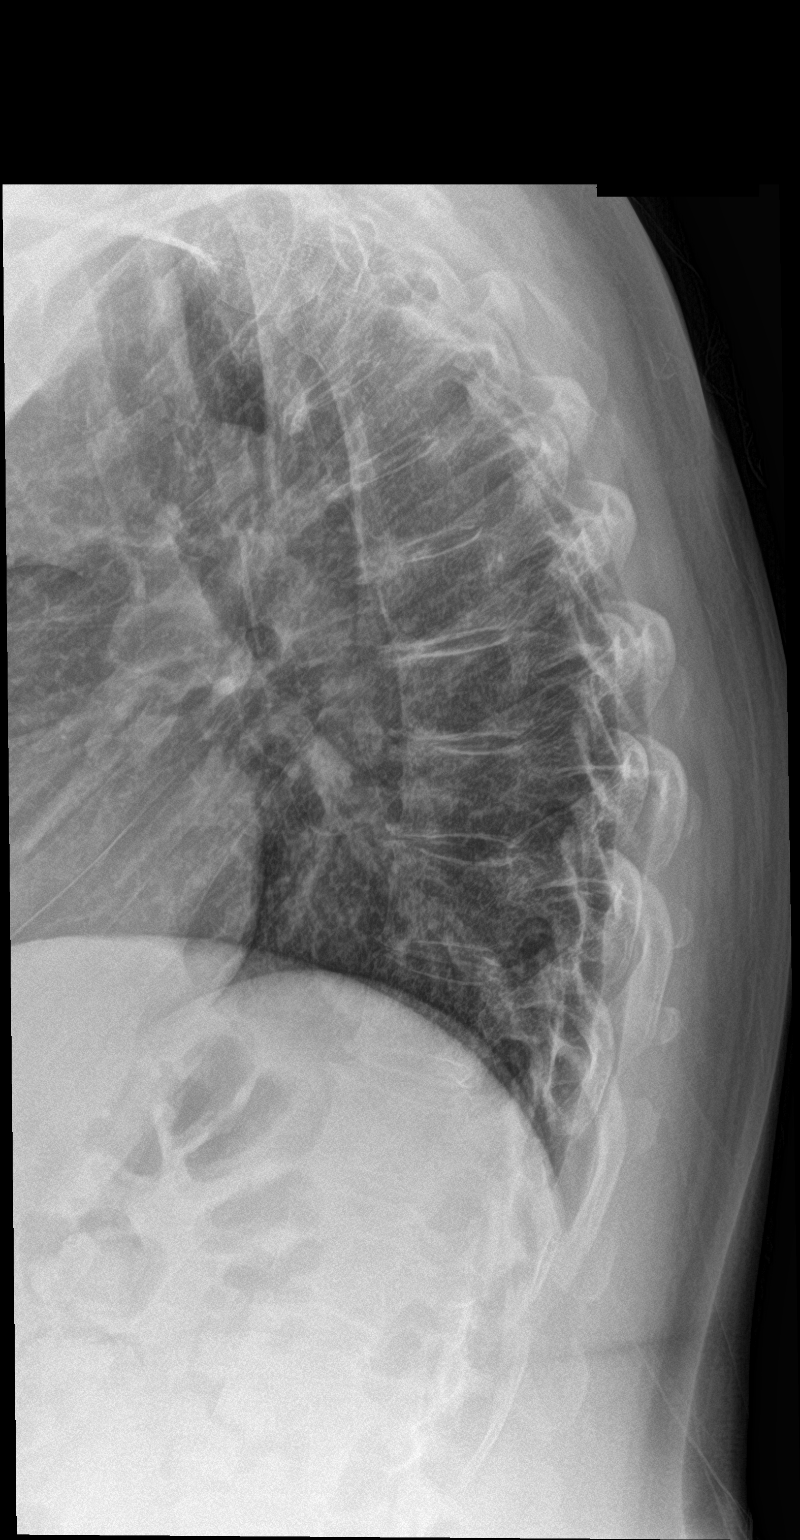

[3 of 3 positions shown; findings below may reference images not displayed]

FINDINGS: Gentle S-shaped scoliosis of the thoracolumbar spine is noted
concave to the left in the thoracic spine and concave to the right
in the upper lumbar spine. Vertebral body height is well maintained.
Very minimal osteophytic changes are seen. No paraspinal mass is
noted. No pedicle abnormality is seen.
IMPRESSION: Mild degenerative change without acute abnormality.

## 2020-10-06 ENCOUNTER — Telehealth: Payer: Self-pay | Admitting: Internal Medicine

## 2020-10-06 DIAGNOSIS — E7800 Pure hypercholesterolemia, unspecified: Secondary | ICD-10-CM

## 2020-10-06 NOTE — Telephone Encounter (Signed)
lvm to schedule appt.  

## 2020-10-06 NOTE — Telephone Encounter (Signed)
Mitzo- can you contact Pt (she speaks spanish only)- she is overdue for 3 month f/u. Please schedule at her earliest convenience. Thank you.

## 2020-10-17 NOTE — Telephone Encounter (Signed)
lvm to schedule appt.  

## 2020-12-22 ENCOUNTER — Other Ambulatory Visit: Payer: Self-pay

## 2020-12-22 ENCOUNTER — Ambulatory Visit (INDEPENDENT_AMBULATORY_CARE_PROVIDER_SITE_OTHER): Payer: BC Managed Care – PPO | Admitting: Internal Medicine

## 2020-12-22 ENCOUNTER — Encounter: Payer: Self-pay | Admitting: Internal Medicine

## 2020-12-22 VITALS — BP 135/84 | HR 71 | Temp 98.1°F | Resp 16 | Ht 63.0 in | Wt 200.1 lb

## 2020-12-22 DIAGNOSIS — Z23 Encounter for immunization: Secondary | ICD-10-CM

## 2020-12-22 DIAGNOSIS — R011 Cardiac murmur, unspecified: Secondary | ICD-10-CM

## 2020-12-22 DIAGNOSIS — E119 Type 2 diabetes mellitus without complications: Secondary | ICD-10-CM

## 2020-12-22 DIAGNOSIS — Z1159 Encounter for screening for other viral diseases: Secondary | ICD-10-CM | POA: Diagnosis not present

## 2020-12-22 DIAGNOSIS — E7800 Pure hypercholesterolemia, unspecified: Secondary | ICD-10-CM

## 2020-12-22 DIAGNOSIS — D649 Anemia, unspecified: Secondary | ICD-10-CM

## 2020-12-22 LAB — CBC WITH DIFFERENTIAL/PLATELET
Basophils Absolute: 0 10*3/uL (ref 0.0–0.1)
Basophils Relative: 0.8 % (ref 0.0–3.0)
Eosinophils Absolute: 0.1 10*3/uL (ref 0.0–0.7)
Eosinophils Relative: 1.3 % (ref 0.0–5.0)
HCT: 37.4 % (ref 36.0–46.0)
Hemoglobin: 12.4 g/dL (ref 12.0–15.0)
Lymphocytes Relative: 49.7 % — ABNORMAL HIGH (ref 12.0–46.0)
Lymphs Abs: 2.4 10*3/uL (ref 0.7–4.0)
MCHC: 33.1 g/dL (ref 30.0–36.0)
MCV: 87.8 fl (ref 78.0–100.0)
Monocytes Absolute: 0.3 10*3/uL (ref 0.1–1.0)
Monocytes Relative: 5.9 % (ref 3.0–12.0)
Neutro Abs: 2 10*3/uL (ref 1.4–7.7)
Neutrophils Relative %: 42.3 % — ABNORMAL LOW (ref 43.0–77.0)
Platelets: 178 10*3/uL (ref 150.0–400.0)
RBC: 4.26 Mil/uL (ref 3.87–5.11)
RDW: 13.6 % (ref 11.5–15.5)
WBC: 4.8 10*3/uL (ref 4.0–10.5)

## 2020-12-22 LAB — MICROALBUMIN / CREATININE URINE RATIO
Creatinine,U: 134.8 mg/dL
Microalb Creat Ratio: 0.6 mg/g (ref 0.0–30.0)
Microalb, Ur: 0.8 mg/dL (ref 0.0–1.9)

## 2020-12-22 LAB — FERRITIN: Ferritin: 84.2 ng/mL (ref 10.0–291.0)

## 2020-12-22 LAB — IRON: Iron: 104 ug/dL (ref 42–145)

## 2020-12-22 LAB — HEMOGLOBIN A1C: Hgb A1c MFr Bld: 6.4 % (ref 4.6–6.5)

## 2020-12-22 MED ORDER — ATORVASTATIN CALCIUM 10 MG PO TABS: 10.0000 mg | ORAL_TABLET | Freq: Every day | ORAL | 1 refills | Status: DC

## 2020-12-22 NOTE — Progress Notes (Signed)
Pre visit review using our clinic review tool, if applicable. No additional management support is needed unless otherwise documented below in the visit note. 

## 2020-12-22 NOTE — Progress Notes (Signed)
   Subjective:    Patient ID: Amy Davidson, female    DOB: 06-27-1971, 50 y.o.   MRN: [id number]  DOS:  12/22/2020 Type of visit - description: Follow-up Since the last office visit she is doing well.   Wt Readings from Last 3 Encounters:  12/22/20 200 lb 2 oz (90.8 kg)  04/17/20 184 lb 6 oz (83.6 kg)  03/20/20 190 lb 2 oz (86.2 kg)     Review of Systems Denies nausea, vomiting, diarrhea.  No blood in the stools.  Past Medical History:  Diagnosis Date  . Contraception    none  . Diabetes mellitus without complication (HCC) 10/30/2018    Past Surgical History:  Procedure Laterality Date  . CESAREAN SECTION  06/23/2011   Procedure: CESAREAN SECTION;  Surgeon: Leighton Roach Meisinger;  Location: [facility];  Service: Gynecology;  Laterality: N/A;  Primary cesarean section with delivery of baby boy at 74. Apgars 8/9.    Allergies as of 12/22/2020   No Known Allergies     Medication List       Accurate as of December 22, 2020 10:28 AM. If you have any questions, ask your nurse or doctor.        atorvastatin 10 MG tablet Commonly known as: LIPITOR Take 1 tablet (10 mg total) by mouth daily.          Objective:   Physical Exam BP 135/84 (BP Location: Left Arm, Patient Position: Sitting, Cuff Size: Normal)   Pulse 71   Temp 98.1 F (36.7 C) (Oral)   Resp 16   Ht 5\' 3"  (1.6 m)   Wt 200 lb 2 oz (90.8 kg)   LMP 03/25/2017 (Approximate)   SpO2 99%   BMI 35.45 kg/m  General:   Well developed, NAD, BMI noted. HEENT:  Normocephalic . Face symmetric, atraumatic Lungs:  CTA B Normal respiratory effort, no intercostal retractions, no accessory muscle use. Heart: RRR,  no murmur.  DM foot exam: No edema, good pedal pulses, pinprick examination normal Skin: Not pale. Not jaundice Neurologic:  alert & oriented X3.  Speech normal, gait appropriate for age and unassisted Psych--  Cognition and judgment appear intact.  Cooperative with normal attention span and  concentration.  Behavior appropriate. No anxious or depressed appearing.      Assessment     Assessment DM:  A1c 5.9 2016; A1C 6.30 August 2018 Hyperlipidemia  Menopausal, 2019 Heart murmur, normal echo 03/2020 COVID-19 >>> 01/2020  PLAN DM: Diet controlled, I noted weight gain, extensive discussion about the need to increase physical activity and eat healthy.  Check a A1c, micro.  Foot exam negative. Hyperlipidemia: She restarted Lipitor since the last visit, took it temporarily b/c she ran out, RF sent.  FLP at the next visit Heart murmur: Cards referral failed.  Normal echo 5/21, asx, we agreed on observation. Anemia: Minimal decrease in hemoglobin, no GI symptoms, menopausal, recheck CBC, iron, ferritin. Thoracic pain, left-sided chest pain: Resolved Preventive care: Had 3 COVID shots, had a flu shot already.  Tdap today, screen for hepatitis C today. RTC April 2022 CPX      This visit occurred during the SARS-CoV-2 public health emergency.  Safety protocols were in place, including screening questions prior to the visit, additional usage of staff PPE, and extensive cleaning of exam room while observing appropriate contact time as indicated for disinfecting solutions.

## 2020-12-22 NOTE — Patient Instructions (Addendum)
Tome atorvastatin de nuevo , una cada noche    GO TO THE LAB : Get the blood work     GO TO THE FRONT DESK, PLEASE SCHEDULE YOUR APPOINTMENTS Come back for a physical exam by April

## 2020-12-23 NOTE — Assessment & Plan Note (Signed)
DM: Diet controlled, I noted weight gain, extensive discussion about the need to increase physical activity and eat healthy.  Check a A1c, micro.  Foot exam negative. Hyperlipidemia: She restarted Lipitor since the last visit, took it temporarily b/c she ran out, RF sent.  FLP at the next visit Heart murmur: Cards referral failed.  Normal echo 5/21, asx, we agreed on observation. Anemia: Minimal decrease in hemoglobin, no GI symptoms, menopausal, recheck CBC, iron, ferritin. Thoracic pain, left-sided chest pain: Resolved Preventive care: Had 3 COVID shots, had a flu shot already.  Tdap today, screen for hepatitis C today. RTC April 2022 CPX

## 2020-12-25 LAB — HEPATITIS C ANTIBODY
Hepatitis C Ab: NONREACTIVE
SIGNAL TO CUT-OFF: 0.02 (ref <1.00)

## 2020-12-27 ENCOUNTER — Encounter: Payer: Self-pay | Admitting: Internal Medicine

## 2021-02-07 ENCOUNTER — Ambulatory Visit
Admission: RE | Admit: 2021-02-07 | Discharge: 2021-02-07 | Disposition: A | Discharge status: Home / Self Care | Payer: BC Managed Care – PPO | Source: Ambulatory Visit | Attending: Obstetrics and Gynecology | Admitting: Obstetrics and Gynecology

## 2021-02-07 ENCOUNTER — Other Ambulatory Visit: Payer: Self-pay

## 2021-02-07 ENCOUNTER — Other Ambulatory Visit: Payer: Self-pay | Admitting: Obstetrics and Gynecology

## 2021-02-07 DIAGNOSIS — R922 Inconclusive mammogram: Secondary | ICD-10-CM | POA: Diagnosis not present

## 2021-02-07 DIAGNOSIS — R928 Other abnormal and inconclusive findings on diagnostic imaging of breast: Secondary | ICD-10-CM

## 2021-02-07 DIAGNOSIS — N6323 Unspecified lump in the left breast, lower outer quadrant: Secondary | ICD-10-CM | POA: Diagnosis not present

## 2021-03-22 ENCOUNTER — Other Ambulatory Visit: Payer: Self-pay

## 2021-03-22 ENCOUNTER — Ambulatory Visit (INDEPENDENT_AMBULATORY_CARE_PROVIDER_SITE_OTHER): Payer: BC Managed Care – PPO | Admitting: Internal Medicine

## 2021-03-22 ENCOUNTER — Encounter: Payer: Self-pay | Admitting: Internal Medicine

## 2021-03-22 VITALS — BP 116/78 | HR 86 | Temp 98.3°F | Resp 16 | Ht 63.0 in | Wt 206.5 lb

## 2021-03-22 DIAGNOSIS — E119 Type 2 diabetes mellitus without complications: Secondary | ICD-10-CM

## 2021-03-22 DIAGNOSIS — Z0000 Encounter for general adult medical examination without abnormal findings: Secondary | ICD-10-CM | POA: Diagnosis not present

## 2021-03-22 DIAGNOSIS — E785 Hyperlipidemia, unspecified: Secondary | ICD-10-CM | POA: Diagnosis not present

## 2021-03-22 LAB — CBC WITH DIFFERENTIAL/PLATELET
Basophils Absolute: 0.1 10*3/uL (ref 0.0–0.1)
Basophils Relative: 1 % (ref 0.0–3.0)
Eosinophils Absolute: 0.1 10*3/uL (ref 0.0–0.7)
Eosinophils Relative: 2.5 % (ref 0.0–5.0)
HCT: 36.8 % (ref 36.0–46.0)
Hemoglobin: 12.3 g/dL (ref 12.0–15.0)
Lymphocytes Relative: 39.1 % (ref 12.0–46.0)
Lymphs Abs: 2.3 10*3/uL (ref 0.7–4.0)
MCHC: 33.4 g/dL (ref 30.0–36.0)
MCV: 87 fl (ref 78.0–100.0)
Monocytes Absolute: 0.4 10*3/uL (ref 0.1–1.0)
Monocytes Relative: 6.2 % (ref 3.0–12.0)
Neutro Abs: 3.1 10*3/uL (ref 1.4–7.7)
Neutrophils Relative %: 51.2 % (ref 43.0–77.0)
Platelets: 193 10*3/uL (ref 150.0–400.0)
RBC: 4.23 Mil/uL (ref 3.87–5.11)
RDW: 13.7 % (ref 11.5–15.5)
WBC: 6 10*3/uL (ref 4.0–10.5)

## 2021-03-22 LAB — COMPREHENSIVE METABOLIC PANEL
ALT: 22 U/L (ref 0–35)
AST: 20 U/L (ref 0–37)
Albumin: 4.7 g/dL (ref 3.5–5.2)
Alkaline Phosphatase: 83 U/L (ref 39–117)
BUN: 16 mg/dL (ref 6–23)
CO2: 32 mEq/L (ref 19–32)
Calcium: 9.3 mg/dL (ref 8.4–10.5)
Chloride: 103 mEq/L (ref 96–112)
Creatinine, Ser: 0.66 mg/dL (ref 0.40–1.20)
GFR: 102.39 mL/min (ref >60.00)
Glucose, Bld: 96 mg/dL (ref 70–99)
Potassium: 3.9 mEq/L (ref 3.5–5.1)
Sodium: 141 mEq/L (ref 135–145)
Total Bilirubin: 0.4 mg/dL (ref 0.2–1.2)
Total Protein: 7.2 g/dL (ref 6.0–8.3)

## 2021-03-22 LAB — HEMOGLOBIN A1C: Hgb A1c MFr Bld: 6.6 % — ABNORMAL HIGH (ref 4.6–6.5)

## 2021-03-22 LAB — LIPID PANEL
Cholesterol: 135 mg/dL (ref 0–200)
HDL: 48.1 mg/dL (ref >39.00)
LDL Cholesterol: 68 mg/dL (ref 0–99)
NonHDL: 87.08
Total CHOL/HDL Ratio: 3
Triglycerides: 97 mg/dL (ref 0.0–149.0)
VLDL: 19.4 mg/dL (ref 0.0–40.0)

## 2021-03-22 LAB — TSH: TSH: 3.09 u[IU]/mL (ref 0.35–4.50)

## 2021-03-22 NOTE — Progress Notes (Signed)
Subjective:    Patient ID: Amy Davidson, female    DOB: 05-30-1971, 50 y.o.   MRN: [id number]  DOS:  03/22/2021 Type of visit - description: Here for CPX In general feeling well. She has been unable to lose weight despite what she describes as a healthy diet and taking a walk 3 hours out of every week.  Wt Readings from Last 3 Encounters:  03/22/21 206 lb 8 oz (93.7 kg)  12/22/20 200 lb 2 oz (90.8 kg)  04/17/20 184 lb 6 oz (83.6 kg)     Review of Systems  Other than above, a 14 point review of systems is negative      Past Medical History:  Diagnosis Date  . Contraception    none  . Diabetes mellitus without complication (HCC) 10/30/2018    Past Surgical History:  Procedure Laterality Date  . CESAREAN SECTION  06/23/2011   Procedure: CESAREAN SECTION;  Surgeon: Leighton Roach Meisinger;  Location: [facility];  Service: Gynecology;  Laterality: N/A;  Primary cesarean section with delivery of baby boy at 39. Apgars 8/9.   Social History   Socioeconomic History  . Marital status: Married    Spouse name: Not on file  . Number of children: 5  . Years of education: Not on file  . Highest education level: Not on file  Occupational History  . Occupation: factory job  Tobacco Use  . Smoking status: Never Smoker  . Smokeless tobacco: Never Used  Substance and Sexual Activity  . Alcohol use: No    Alcohol/week: 0.0 standard drinks  . Drug use: No  . Sexual activity: Not on file  Other Topics Concern  . Not on file  Social History Narrative   Married, lives w/ husband and 5 children, from [place]   Oldest born 2000   Household: pt, husband, 4 children    Social Determinants of Health   Financial Resource Strain: Not on file  Food Insecurity: Not on file  Transportation Needs: Not on file  Physical Activity: Not on file  Stress: Not on file  Social Connections: Not on file  Intimate Partner Violence: Not on file     Allergies as of 03/22/2021   No Known  Allergies     Medication List       Accurate as of March 22, 2021 11:59 PM. If you have any questions, ask your nurse or doctor.        atorvastatin 10 MG tablet Commonly known as: LIPITOR Take 1 tablet (10 mg total) by mouth daily.          Objective:   Physical Exam BP 116/78 (BP Location: Left Arm, Patient Position: Sitting, Cuff Size: Normal)   Pulse 86   Temp 98.3 F (36.8 C) (Oral)   Resp 16   Ht 5\' 3"  (1.6 m)   Wt 206 lb 8 oz (93.7 kg)   LMP 03/25/2017 (Approximate)   SpO2 98%   BMI 36.58 kg/m  General: Well developed, NAD, BMI noted Neck: No  thyromegaly  HEENT:  Normocephalic . Face symmetric, atraumatic Lungs:  CTA B Normal respiratory effort, no intercostal retractions, no accessory muscle use. Heart: RRR, mild systolic murmur Abdomen:  Not distended, soft, non-tender. No rebound or rigidity.   Lower extremities: no pretibial edema bilaterally  Skin: Exposed areas without rash. Not pale. Not jaundice Neurologic:  alert & oriented X3.  Speech normal, gait appropriate for age and unassisted Strength symmetric and appropriate for age.  Psych: Cognition  and judgment appear intact.  Cooperative with normal attention span and concentration.  Behavior appropriate. No anxious or depressed appearing.     Assessment     Assessment DM:  A1c 5.9 2016; A1C 6.30 August 2018 Hyperlipidemia  Menopausal, 2019 Heart murmur, normal echo 03/2020 COVID-19 >>> 01/2020  PLAN Here for CPX DM: Diet controlled, checking labs Hyperlipidemia: On atorvastatin, checking labs Heart murmur: No symptoms Weight gain: Counseled the best I could however she tells me she is eating healthy and take walks regularly.  Reassess in 6 months RTC 6 months  This visit occurred during the SARS-CoV-2 public health emergency.  Safety protocols were in place, including screening questions prior to the visit, additional usage of staff PPE, and extensive cleaning of exam room while  observing appropriate contact time as indicated for disinfecting solutions.

## 2021-03-22 NOTE — Patient Instructions (Signed)
tomese la [place] de sangre regrese la prueba de las heces a esta oficina saque una cita para regresar en 6 meses    GO TO THE LAB : Get the blood work     GO TO THE FRONT DESK, PLEASE SCHEDULE YOUR APPOINTMENTS Come back for a checkup in 6 months

## 2021-03-24 ENCOUNTER — Encounter: Payer: Self-pay | Admitting: Internal Medicine

## 2021-03-24 NOTE — Assessment & Plan Note (Signed)
Here for CPX DM: Diet controlled, checking labs Hyperlipidemia: On atorvastatin, checking labs Heart murmur: No symptoms Weight gain: Counseled the best I could however she tells me she is eating healthy and take walks regularly.  Reassess in 6 months RTC 6 months

## 2021-03-24 NOTE — Assessment & Plan Note (Signed)
Td 2022 Shingrix: Not interested at this point [facility] 23 12-2018 Covid vax x 3 Female care: PAP 06/2020 and  MMG 3/22 per [facility]  Menopausal 2019: Will recommend DEXA by 2024.  Rec vitamin D. CCS: 3 modalities discussed, failed to return Ifob last year, will try again. Labs:CMP, FLP, CBC, A1c, TSH Diet and exercise: Discussed

## 2021-04-04 ENCOUNTER — Telehealth: Payer: Self-pay | Admitting: Internal Medicine

## 2021-04-05 ENCOUNTER — Other Ambulatory Visit (INDEPENDENT_AMBULATORY_CARE_PROVIDER_SITE_OTHER): Payer: BC Managed Care – PPO

## 2021-04-05 DIAGNOSIS — Z0000 Encounter for general adult medical examination without abnormal findings: Secondary | ICD-10-CM

## 2021-04-05 LAB — FECAL OCCULT BLOOD, IMMUNOCHEMICAL: Fecal Occult Bld: NEGATIVE

## 2021-05-05 LAB — HM DIABETES EYE EXAM

## 2021-05-07 ENCOUNTER — Encounter: Payer: Self-pay | Admitting: Internal Medicine

## 2021-07-10 IMAGING — US US BREAST*L* LIMITED INC AXILLA
1 series · 6 of 6 positions shown · non-contrast
Comparison: Previous exam(s).

CLINICAL DATA: 50-year-old female presenting for first six-month
follow-up of a probably benign left breast mass.

EXAM:
DIGITAL DIAGNOSTIC UNILATERAL LEFT MAMMOGRAM WITH TOMOSYNTHESIS AND
CAD; ULTRASOUND LEFT BREAST LIMITED
TECHNIQUE: Left digital diagnostic mammography and breast tomosynthesis was
performed. The images were evaluated with computer-aided detection.;
Targeted ultrasound examination of the left breast was performed

[Series 1: us breast*left* limited inc axilla · 0.06mm/px · 6 of 6 slices shown]
[im 1/6]
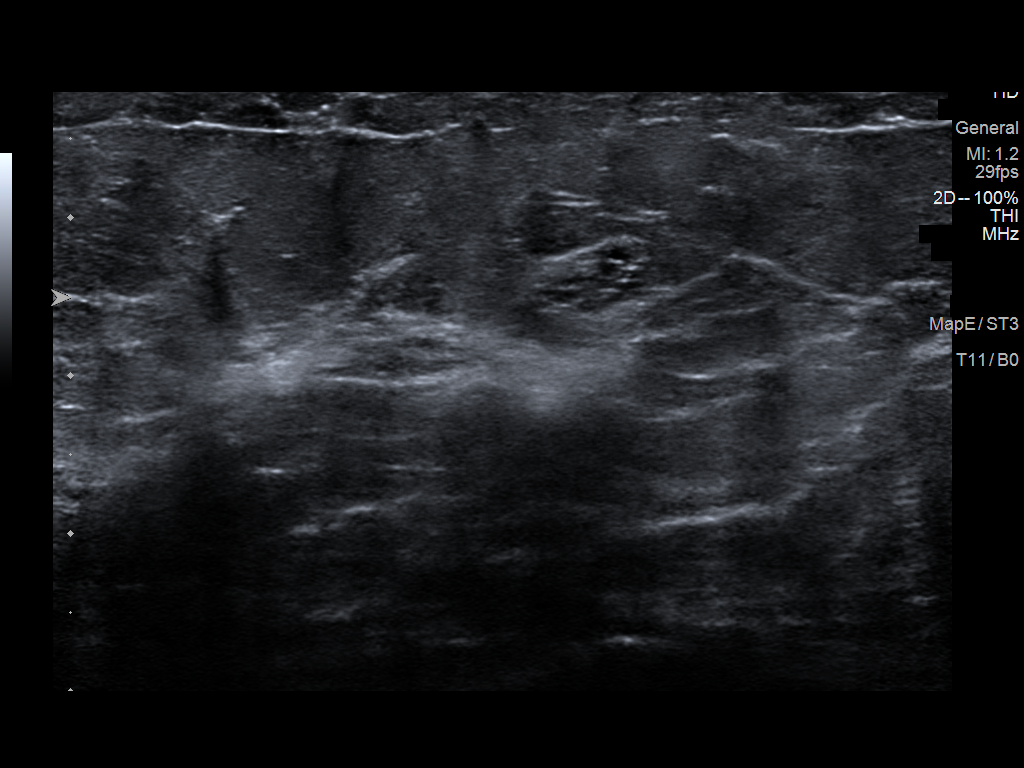
[im 2/6]
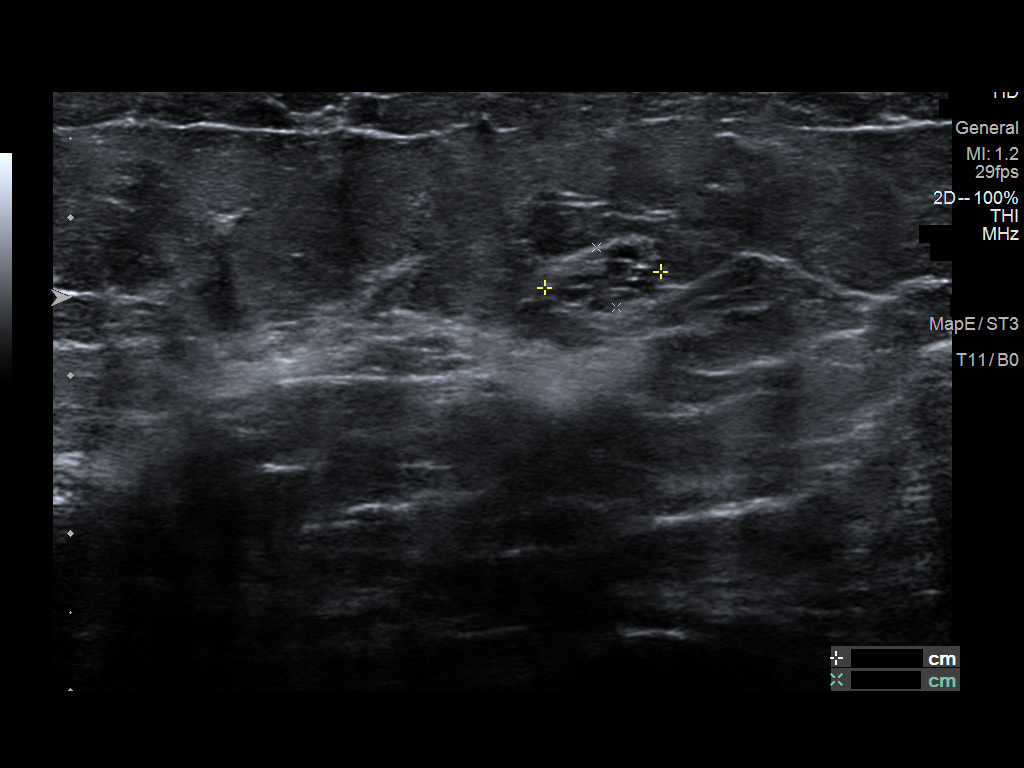
[im 3/6]
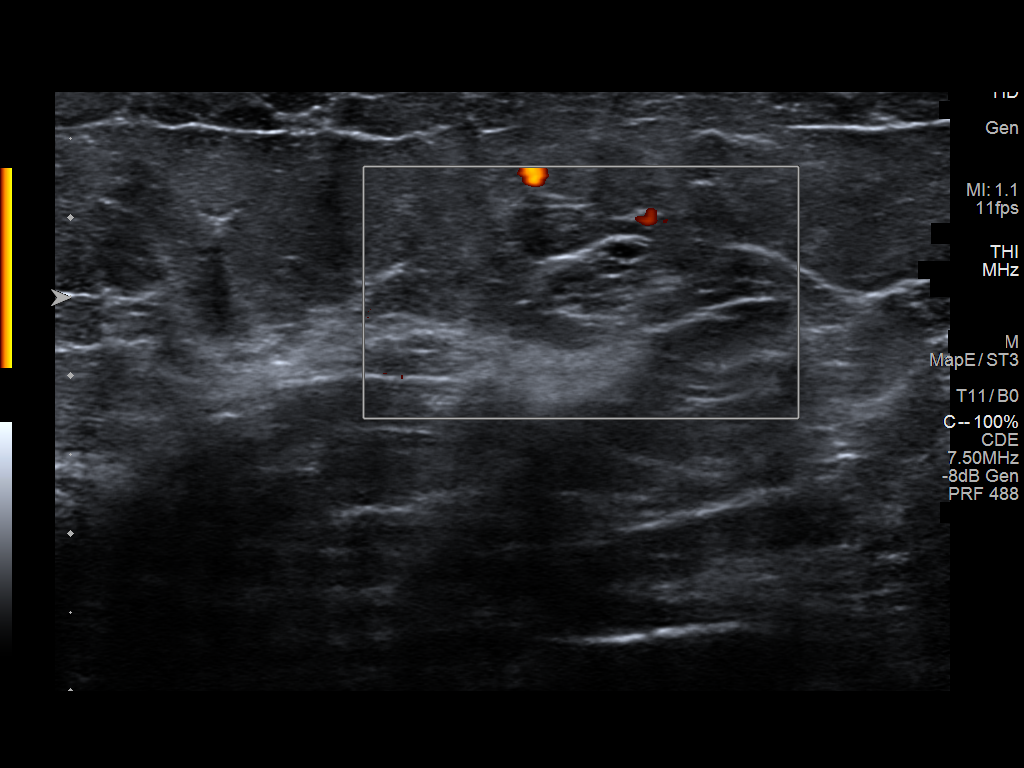
[im 4/6]
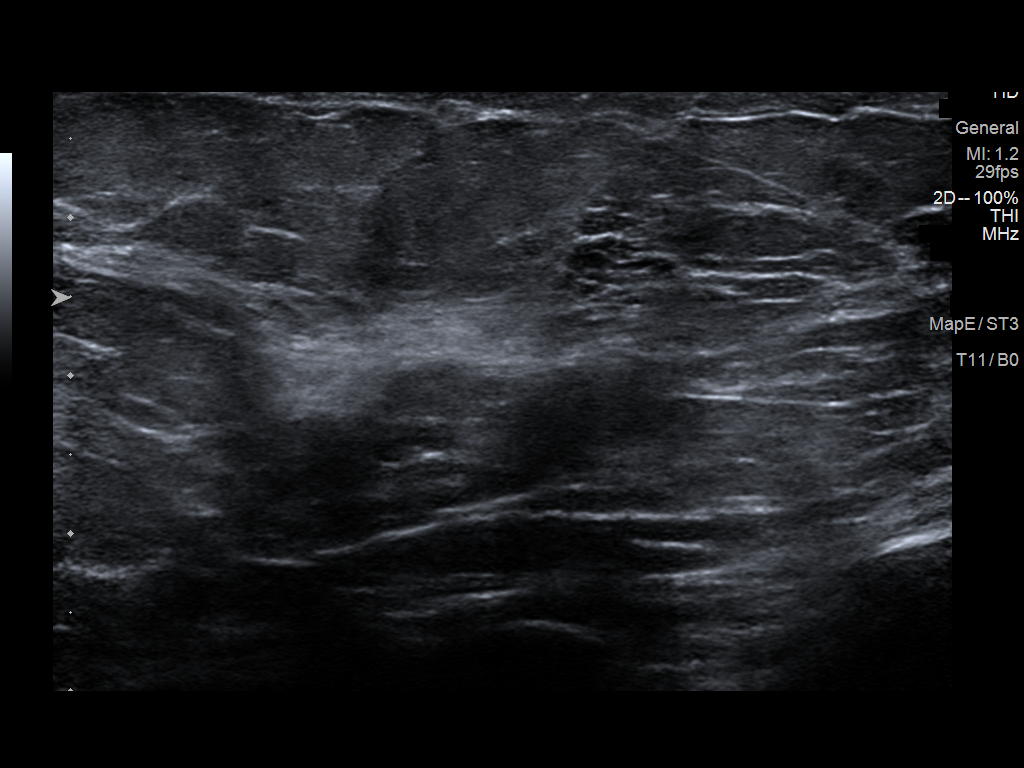
[im 5/6]
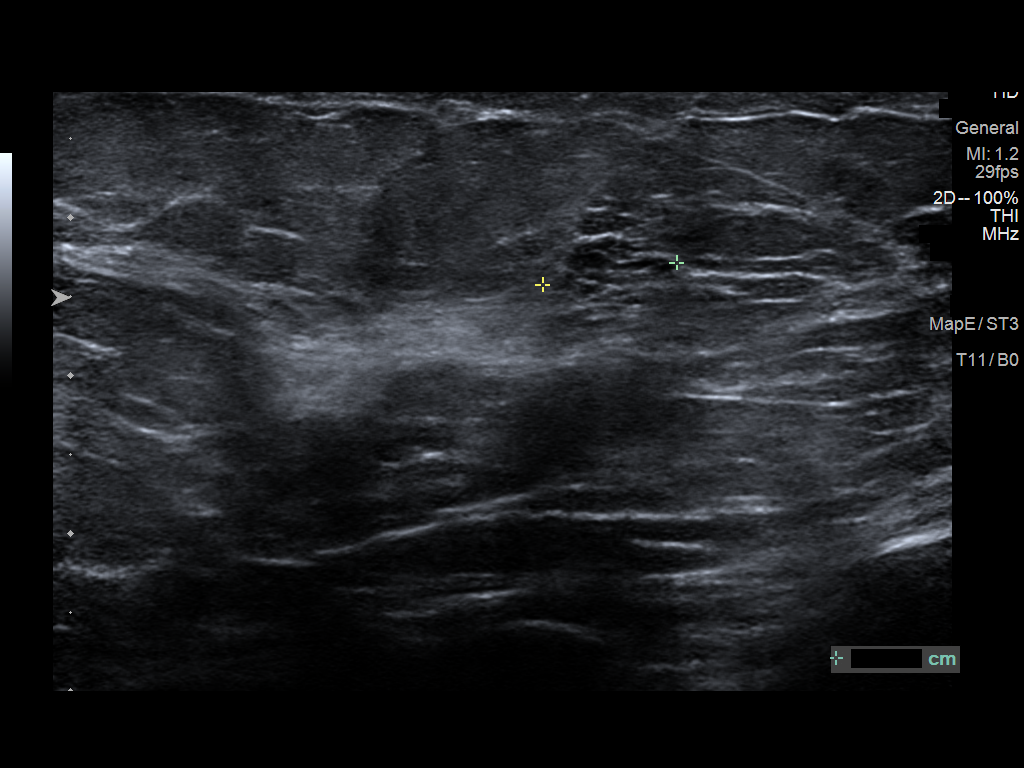
[im 6/6]
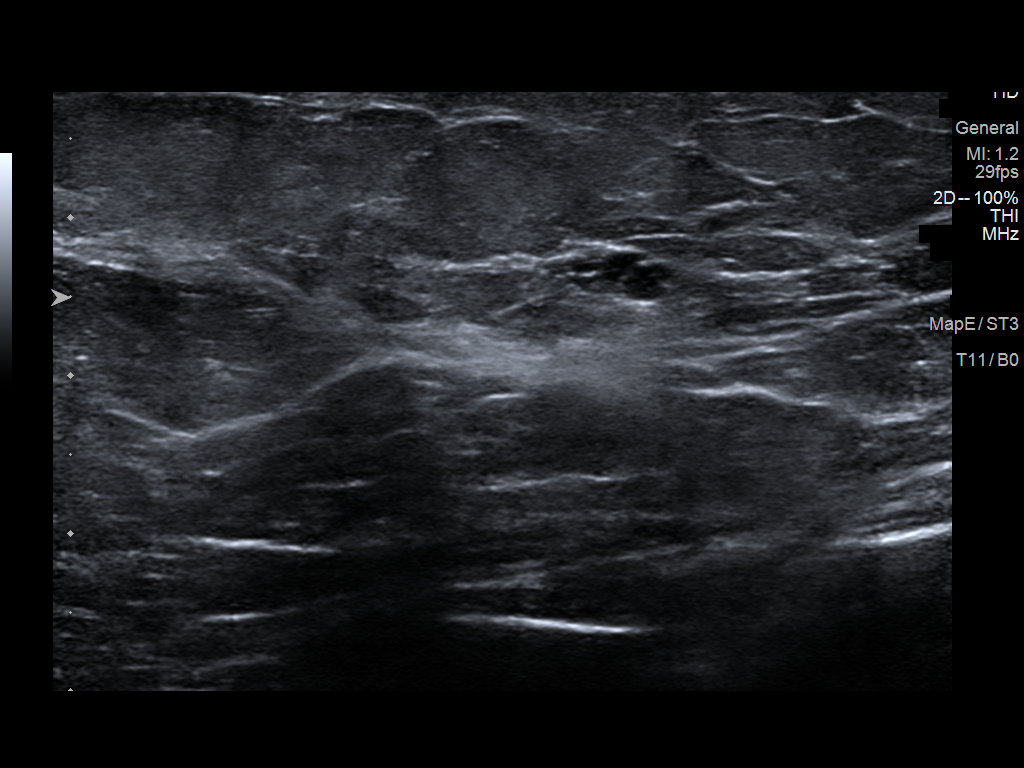

[6 of 6 positions shown; findings below may reference images not displayed]

ACR Breast Density Category b: There are scattered areas of
fibroglandular density.
FINDINGS: Circumscribed mass in the lateral left breast is mammographically
stable. No new or suspicious findings in the remainder of the left
breast.

Targeted ultrasound is performed, showing stable appearance of an
oval, circumscribed multi-cystic mass at the 4 o'clock position 4 cm
from the nipple. It measures 9 x 7 by mm (previously 9 x 7 x 4 mm).
IMPRESSION: Stable, probably benign left breast mass. Recommend continued
short-term imaging follow-up.

RECOMMENDATION:
Bilateral diagnostic mammogram and left breast ultrasound in 6
months.

I have discussed the findings and recommendations with the patient.
If applicable, a reminder letter will be sent to the patient
regarding the next appointment.

BI-RADS CATEGORY  3: Probably benign.

## 2021-08-15 ENCOUNTER — Other Ambulatory Visit: Payer: BC Managed Care – PPO

## 2021-09-21 DIAGNOSIS — Z23 Encounter for immunization: Secondary | ICD-10-CM | POA: Diagnosis not present

## 2021-09-25 ENCOUNTER — Ambulatory Visit: Payer: BC Managed Care – PPO | Admitting: Internal Medicine

## 2021-10-05 ENCOUNTER — Encounter: Payer: Self-pay | Admitting: Internal Medicine

## 2022-01-23 ENCOUNTER — Telehealth: Payer: Self-pay

## 2022-01-23 NOTE — Telephone Encounter (Signed)
LVM in spanish asking pt to call back and schedule follow up appt with [facility].  ?

## 2022-01-23 NOTE — Telephone Encounter (Signed)
Pt is overdue for routine follow-up- she speaks spanish, will you try to contact her to schedule a visit please?  ?

## 2022-03-20 ENCOUNTER — Encounter: Payer: Self-pay | Admitting: Internal Medicine

## 2022-04-04 ENCOUNTER — Encounter: Payer: Self-pay | Admitting: Internal Medicine

## 2022-04-04 ENCOUNTER — Ambulatory Visit (INDEPENDENT_AMBULATORY_CARE_PROVIDER_SITE_OTHER): Payer: BC Managed Care – PPO | Admitting: Internal Medicine

## 2022-04-04 VITALS — BP 124/62 | HR 62 | Temp 98.3°F | Resp 16 | Ht 63.0 in | Wt 204.2 lb

## 2022-04-04 DIAGNOSIS — E7800 Pure hypercholesterolemia, unspecified: Secondary | ICD-10-CM | POA: Diagnosis not present

## 2022-04-04 DIAGNOSIS — E785 Hyperlipidemia, unspecified: Secondary | ICD-10-CM

## 2022-04-04 DIAGNOSIS — E119 Type 2 diabetes mellitus without complications: Secondary | ICD-10-CM | POA: Diagnosis not present

## 2022-04-04 DIAGNOSIS — Z0000 Encounter for general adult medical examination without abnormal findings: Secondary | ICD-10-CM

## 2022-04-04 LAB — CBC WITH DIFFERENTIAL/PLATELET
Basophils Absolute: 0 10*3/uL (ref 0.0–0.1)
Basophils Relative: 0.9 % (ref 0.0–3.0)
Eosinophils Absolute: 0.1 10*3/uL (ref 0.0–0.7)
Eosinophils Relative: 1.8 % (ref 0.0–5.0)
HCT: 36.4 % (ref 36.0–46.0)
Hemoglobin: 12.1 g/dL (ref 12.0–15.0)
Lymphocytes Relative: 45.5 % (ref 12.0–46.0)
Lymphs Abs: 2.4 10*3/uL (ref 0.7–4.0)
MCHC: 33.2 g/dL (ref 30.0–36.0)
MCV: 87.6 fl (ref 78.0–100.0)
Monocytes Absolute: 0.3 10*3/uL (ref 0.1–1.0)
Monocytes Relative: 6.1 % (ref 3.0–12.0)
Neutro Abs: 2.4 10*3/uL (ref 1.4–7.7)
Neutrophils Relative %: 45.7 % (ref 43.0–77.0)
Platelets: 192 10*3/uL (ref 150.0–400.0)
RBC: 4.16 Mil/uL (ref 3.87–5.11)
RDW: 14.3 % (ref 11.5–15.5)
WBC: 5.2 10*3/uL (ref 4.0–10.5)

## 2022-04-04 LAB — COMPREHENSIVE METABOLIC PANEL
ALT: 21 U/L (ref 0–35)
AST: 19 U/L (ref 0–37)
Albumin: 4.6 g/dL (ref 3.5–5.2)
Alkaline Phosphatase: 82 U/L (ref 39–117)
BUN: 14 mg/dL (ref 6–23)
CO2: 31 mEq/L (ref 19–32)
Calcium: 9.2 mg/dL (ref 8.4–10.5)
Chloride: 104 mEq/L (ref 96–112)
Creatinine, Ser: 0.68 mg/dL (ref 0.40–1.20)
GFR: 100.92 mL/min (ref >60.00)
Glucose, Bld: 100 mg/dL — ABNORMAL HIGH (ref 70–99)
Potassium: 4.1 mEq/L (ref 3.5–5.1)
Sodium: 141 mEq/L (ref 135–145)
Total Bilirubin: 0.4 mg/dL (ref 0.2–1.2)
Total Protein: 7.1 g/dL (ref 6.0–8.3)

## 2022-04-04 LAB — TSH: TSH: 2.77 u[IU]/mL (ref 0.35–5.50)

## 2022-04-04 LAB — MICROALBUMIN / CREATININE URINE RATIO
Creatinine,U: 118.9 mg/dL
Microalb Creat Ratio: 0.7 mg/g (ref 0.0–30.0)
Microalb, Ur: 0.9 mg/dL (ref 0.0–1.9)

## 2022-04-04 LAB — HEMOGLOBIN A1C: Hgb A1c MFr Bld: 6.6 % — ABNORMAL HIGH (ref 4.6–6.5)

## 2022-04-04 MED ORDER — ATORVASTATIN CALCIUM 10 MG PO TABS: 10.0000 mg | ORAL_TABLET | Freq: Every day | ORAL | 3 refills | Status: DC

## 2022-04-04 NOTE — Progress Notes (Signed)
? ?Subjective:  ? ? Patient ID: Amy Davidson, female    DOB: March 23, 1971, 51 y.o.   MRN: [id number] ? ?DOS:  04/04/2022 ?Type of visit - description: CPX ? ?Here for CPX ?Since the last visit she is doing well. ?Did report bilateral hand numbness, all fingers.  On and off. ?She works at [employer] and uses her hands and wrists all the time.  ? ?Review of Systems ? ?Other than above, a 14 point review of systems is negative  ? ? ? ?Past Medical History:  ?Diagnosis Date  ? Contraception   ? none  ? Diabetes mellitus without complication (HCC) 10/30/2018  ? ? ?Past Surgical History:  ?Procedure Laterality Date  ? CESAREAN SECTION  06/23/2011  ? Procedure: CESAREAN SECTION;  Surgeon: Leighton Roach Meisinger;  Location: [facility];  Service: Gynecology;  Laterality: N/A;  Primary cesarean section with delivery of baby boy at 68. Apgars 8/9.  ? ?Social History  ? ?Socioeconomic History  ? Marital status: Married  ?  Spouse name: Not on file  ? Number of children: 5  ? Years of education: Not on file  ? Highest education level: Not on file  ?Occupational History  ? Occupation: factory job  ?Tobacco Use  ? Smoking status: Never  ? Smokeless tobacco: Never  ?Substance and Sexual Activity  ? Alcohol use: No  ?  Alcohol/week: 0.0 standard drinks  ? Drug use: No  ? Sexual activity: Not on file  ?Other Topics Concern  ? Not on file  ?Social History Narrative  ? Married, lives w/ husband and 5 children, from [place]  ? Oldest born 2000  ? Household: pt, husband, 4 children   ? ?Social Determinants of Health  ? ?Financial Resource Strain: Not on file  ?Food Insecurity: Not on file  ?Transportation Needs: Not on file  ?Physical Activity: Not on file  ?Stress: Not on file  ?Social Connections: Not on file  ?Intimate Partner Violence: Not on file  ? ? ?Current Outpatient Medications  ?Medication Instructions  ? atorvastatin (LIPITOR) 10 mg, Oral, Daily  ? ? ?   ?Objective:  ? Physical Exam ?BP 124/62 (BP Location: Left Arm, Patient  Position: Sitting, Cuff Size: Small)   Pulse 62   Temp 98.3 ?F (36.8 ?C) (Oral)   Resp 16   Ht 5\' 3"  (1.6 m)   Wt 204 lb 4 oz (92.6 kg)   LMP 03/25/2017 (Approximate)   SpO2 98%   BMI 36.18 kg/m?  ?General: ?Well developed, NAD, BMI noted ?Neck: No  thyromegaly  ?HEENT:  ?Normocephalic . Face symmetric, atraumatic ?Lungs:  ?CTA B ?Normal respiratory effort, no intercostal retractions, no accessory muscle use. ?Heart: RRR, + systolic murmur.  ?Abdomen:  ?Not distended, soft, non-tender. No rebound or rigidity. ?Upper extremities: Hands and wrists with no synovitis, normal to inspection and palpation ?Lower extremities: no pretibial edema bilaterally  ?Skin: Exposed areas without rash. Not pale. Not jaundice ?Neurologic:  ?alert & oriented X3.  ?Speech normal, gait appropriate for age and unassisted ?Strength symmetric and appropriate for age.  ?Psych: ?Cognition and judgment appear intact.  ?Cooperative with normal attention span and concentration.  ?Behavior appropriate. ?No anxious or depressed appearing. ? ?   ?Assessment   ? ? Assessment ?DM:  A1c 5.9 2016; A1C 6.30 August 2018 ?Hyperlipidemia  ?Menopausal, 2019 ?Heart murmur, normal echo 03/2020 ?  ? ?PLAN ?Here for CPX ?DM: Diet controlled, feet exam negative, check A1c and micro.  Diet and exercise encouraged ?Hyperlipidemia:  Was well controlled on atorvastatin, she ran out, RF sent.  Reassess in 6 months ?Heart murmur: Stable on physical exam, normal echo 03/2020.  Reassess yearly. ?Hand numbness: Atypical for CTS  (sxs in all fingers), could be related to overuse syndrome, she works at [employer].  Plan: Recommend CTS splints at night.  Reassess in 6 months. ?All instructions discussed in Spanish ?RTC 6 months ? ? ?  ?

## 2022-04-04 NOTE — Assessment & Plan Note (Signed)
Here for CPX ?DM: Diet controlled, feet exam negative, check A1c and micro.  Diet and exercise encouraged ?Hyperlipidemia: Was well controlled on atorvastatin, she ran out, RF sent.  Reassess in 6 months ?Heart murmur: Stable on physical exam, normal echo 03/2020.  Reassess yearly. ?Hand numbness: Atypical for CTS  (sxs in all fingers), could be related to overuse syndrome, she works at [employer].  Plan: Recommend CTS splints at night.  Reassess in 6 months. ?All instructions discussed in Spanish ?RTC 6 months ?

## 2022-04-04 NOTE — Patient Instructions (Addendum)
USE UNOS BRACES TODAS LAS NOCHES  PARA LOS PROBLEMAS DE LAS MANOS  "CARPAL TUNNEL SPLINTER" ? ?  ? ? ?GO TO THE LAB : Get the blood work   ? ? ?GO TO THE FRONT DESK, PLEASE SCHEDULE YOUR APPOINTMENTS ?Come back for a checkup in 6 months ?

## 2022-04-04 NOTE — Assessment & Plan Note (Signed)
Td 2022 ?Shingrix: d/w pt  ?PNM 23 : 12-2018 ?Covid vax : booster is an option, declines  ?female care:  due to see gyn ~ 06/2022, plans to call , MMG 3/22 per [facility] , plans to do @ Gyn ?Menopausal 2019: Will recommend DEXA by 2024.  Vitamin D levels normal, rec vitamin D OTCs daily.    ?ZTI:WPYK (-) 03-2021.  3 options discussed, elected ifob ?Labs: CMP, CBC, A1c, micro, TSH, I fob ?Diet and exercise: Discussed ?  ?

## 2022-04-08 ENCOUNTER — Other Ambulatory Visit (INDEPENDENT_AMBULATORY_CARE_PROVIDER_SITE_OTHER): Payer: BC Managed Care – PPO

## 2022-04-08 DIAGNOSIS — Z0000 Encounter for general adult medical examination without abnormal findings: Secondary | ICD-10-CM

## 2022-04-09 LAB — FECAL OCCULT BLOOD, IMMUNOCHEMICAL: Fecal Occult Bld: NEGATIVE

## 2022-07-16 ENCOUNTER — Encounter: Payer: Self-pay | Admitting: Internal Medicine

## 2022-08-16 DIAGNOSIS — Z23 Encounter for immunization: Secondary | ICD-10-CM | POA: Diagnosis not present

## 2022-09-26 ENCOUNTER — Other Ambulatory Visit: Payer: Self-pay | Admitting: Obstetrics and Gynecology

## 2022-09-26 DIAGNOSIS — Z01419 Encounter for gynecological examination (general) (routine) without abnormal findings: Secondary | ICD-10-CM | POA: Diagnosis not present

## 2022-09-26 DIAGNOSIS — Z1389 Encounter for screening for other disorder: Secondary | ICD-10-CM | POA: Diagnosis not present

## 2022-09-26 DIAGNOSIS — N6320 Unspecified lump in the left breast, unspecified quadrant: Secondary | ICD-10-CM

## 2022-10-08 ENCOUNTER — Ambulatory Visit (INDEPENDENT_AMBULATORY_CARE_PROVIDER_SITE_OTHER): Payer: BC Managed Care – PPO | Admitting: Internal Medicine

## 2022-10-08 ENCOUNTER — Encounter: Payer: Self-pay | Admitting: Internal Medicine

## 2022-10-08 VITALS — BP 126/84 | HR 66 | Temp 97.6°F | Resp 16 | Ht 63.0 in | Wt 206.5 lb

## 2022-10-08 DIAGNOSIS — E7800 Pure hypercholesterolemia, unspecified: Secondary | ICD-10-CM | POA: Diagnosis not present

## 2022-10-08 DIAGNOSIS — I8393 Asymptomatic varicose veins of bilateral lower extremities: Secondary | ICD-10-CM | POA: Diagnosis not present

## 2022-10-08 DIAGNOSIS — E119 Type 2 diabetes mellitus without complications: Secondary | ICD-10-CM

## 2022-10-08 LAB — HEMOGLOBIN A1C: Hgb A1c MFr Bld: 7.1 % — ABNORMAL HIGH (ref 4.6–6.5)

## 2022-10-08 MED ORDER — ATORVASTATIN CALCIUM 10 MG PO TABS: 10.0000 mg | ORAL_TABLET | Freq: Every day | ORAL | 3 refills | Status: DC

## 2022-10-08 NOTE — Progress Notes (Signed)
   Subjective:    Patient ID: Amy Davidson, female    DOB: 1971/01/14, 51 y.o.   MRN: [id number]  DOS:  10/08/2022 Type of visit - description: 41-month follow-up  Since the last office visit is feeling well. Today we talk about her chronic medical problems. Also d/w pt vaccinations.   Review of Systems See above   Past Medical History:  Diagnosis Date   Contraception    none   Diabetes mellitus without complication (HCC) 10/30/2018    Past Surgical History:  Procedure Laterality Date   CESAREAN SECTION  06/23/2011   Procedure: CESAREAN SECTION;  Surgeon: Leighton Roach Meisinger;  Location: [facility];  Service: Gynecology;  Laterality: N/A;  Primary cesarean section with delivery of baby boy at 89. Apgars 8/9.    Current Outpatient Medications  Medication Instructions   atorvastatin (LIPITOR) 10 mg, Oral, Daily       Objective:   Physical Exam BP 126/84   Pulse 66   Temp 97.6 F (36.4 C) (Oral)   Resp 16   Ht 5\' 3"  (1.6 m)   Wt 206 lb 8 oz (93.7 kg)   LMP 03/25/2017 (Approximate)   SpO2 97%   BMI 36.58 kg/m  General:   Well developed, NAD, BMI noted. HEENT:  Normocephalic . Face symmetric, atraumatic Lungs:  CTA B Normal respiratory effort, no intercostal retractions, no accessory muscle use. Heart: RRR,  no murmur.  Lower extremities: No pitting edema, noted superficial varicose veins bilaterally   Calves are soft and nontender. Circumference @ at the calf level the R>L  by 0.5 inches;  more distally R>L by 0.25 inches. Skin: Not pale. Not jaundice Neurologic:  alert & oriented X3.  Speech normal, gait appropriate for age and unassisted Psych--  Cognition and judgment appear intact.  Cooperative with normal attention span and concentration.  Behavior appropriate. No anxious or depressed appearing.      Assessment     Assessment DM:  A1c 5.9 2016; A1C 6.30 August 2018 Hyperlipidemia  Menopausal, 2019 Heart murmur, normal echo 03/2020 Varicose veins:  R leg slightly larger in circumference   PLAN DM: Diet controlled, check A1c. Hyperlipidemia: Ran out of atorvastatin 4 weeks ago, no apparent side effects, RF sent.  Labs on RTC Varicose veins: Currently asxc, I did notice the right leg is slightly larger in circumference, see physical exam, I made this notation for future reference.  No suspicious for DVT today. Vaccines: Had a flu shot at work, declines COVID-vaccine. RTC CPX 6 months

## 2022-10-08 NOTE — Assessment & Plan Note (Signed)
DM: Diet controlled, check A1c. Hyperlipidemia: Ran out of atorvastatin 4 weeks ago, no apparent side effects, RF sent.  Labs on RTC Varicose veins: Currently asxc, I did notice the right leg is slightly larger in circumference, see physical exam, I made this notation for future reference.  No suspicious for DVT today. Vaccines: Had a flu shot at work, declines COVID-vaccine. RTC CPX 6 months

## 2022-10-08 NOTE — Patient Instructions (Addendum)
Saquese la sangre hoy    Siga con atorvastatin, mandamos una receta  Regrese en 6 meses    GO TO THE LAB : Get the blood work     GO TO THE FRONT DESK, PLEASE SCHEDULE YOUR APPOINTMENTS Come back for CPX in 6 months    Per our records you are due for your diabetic eye exam. Please contact your eye doctor to schedule an appointment. Please have them send copies of your office visit notes to [place]. Our fax number is [phone]. If you need a referral to an eye doctor please let us know.

## 2022-10-14 MED ORDER — METFORMIN HCL 500 MG PO TABS: 500.0000 mg | ORAL_TABLET | Freq: Two times a day (BID) | ORAL | 5 refills | Status: DC

## 2022-10-14 MED ORDER — METFORMIN HCL 500 MG PO TABS: 500.0000 mg | ORAL_TABLET | Freq: Two times a day (BID) | ORAL | 3 refills | Status: DC

## 2022-10-14 NOTE — Addendum Note (Signed)
Addended by: Wilford Corner on: 10/14/2022 03:48 PM   Modules accepted: Orders

## 2022-10-14 NOTE — Progress Notes (Signed)
Called but no answer lvm in spanish for patient to call back

## 2022-10-16 ENCOUNTER — Ambulatory Visit
Admission: RE | Admit: 2022-10-16 | Discharge: 2022-10-16 | Disposition: A | Discharge status: Home / Self Care | Payer: BC Managed Care – PPO | Source: Ambulatory Visit | Attending: Obstetrics and Gynecology | Admitting: Obstetrics and Gynecology

## 2022-10-16 DIAGNOSIS — N6323 Unspecified lump in the left breast, lower outer quadrant: Secondary | ICD-10-CM | POA: Diagnosis not present

## 2022-10-16 DIAGNOSIS — N6320 Unspecified lump in the left breast, unspecified quadrant: Secondary | ICD-10-CM

## 2023-01-17 ENCOUNTER — Ambulatory Visit (INDEPENDENT_AMBULATORY_CARE_PROVIDER_SITE_OTHER): Payer: BC Managed Care – PPO | Admitting: Internal Medicine

## 2023-01-17 ENCOUNTER — Encounter: Payer: Self-pay | Admitting: Internal Medicine

## 2023-01-17 VITALS — BP 136/80 | HR 79 | Temp 98.2°F | Resp 16 | Ht 63.0 in | Wt 205.2 lb

## 2023-01-17 DIAGNOSIS — E785 Hyperlipidemia, unspecified: Secondary | ICD-10-CM

## 2023-01-17 DIAGNOSIS — E119 Type 2 diabetes mellitus without complications: Secondary | ICD-10-CM

## 2023-01-17 LAB — BASIC METABOLIC PANEL
BUN: 14 mg/dL (ref 6–23)
CO2: 31 mEq/L (ref 19–32)
Calcium: 9.3 mg/dL (ref 8.4–10.5)
Chloride: 103 mEq/L (ref 96–112)
Creatinine, Ser: 0.75 mg/dL (ref 0.40–1.20)
GFR: 91.75 mL/min (ref >60.00)
Glucose, Bld: 98 mg/dL (ref 70–99)
Potassium: 3.9 mEq/L (ref 3.5–5.1)
Sodium: 142 mEq/L (ref 135–145)

## 2023-01-17 LAB — MICROALBUMIN / CREATININE URINE RATIO
Creatinine,U: 117.9 mg/dL
Microalb Creat Ratio: 0.7 mg/g (ref 0.0–30.0)
Microalb, Ur: 0.8 mg/dL (ref 0.0–1.9)

## 2023-01-17 LAB — AST: AST: 15 U/L (ref 0–37)

## 2023-01-17 LAB — ALT: ALT: 14 U/L (ref 0–35)

## 2023-01-17 LAB — HEMOGLOBIN A1C: Hgb A1c MFr Bld: 6.8 % — ABNORMAL HIGH (ref 4.6–6.5)

## 2023-01-17 NOTE — Progress Notes (Signed)
   Subjective:    Patient ID: Amy Davidson, female    DOB: Nov 17, 1971, 52 y.o.   MRN: [id number]  DOS:  01/17/2023 Type of visit - description: f/u  Since the last office visit he started metformin. Denies nausea vomiting diarrhea. Does not check ambulatory CBGs  Review of Systems See above   Past Medical History:  Diagnosis Date   Contraception    none   Diabetes mellitus without complication ([place]) 123456    Past Surgical History:  Procedure Laterality Date   CESAREAN SECTION  06/23/2011   Procedure: CESAREAN SECTION;  Surgeon: Blane Ohara Meisinger;  Location: [facility];  Service: Gynecology;  Laterality: N/A;  Primary cesarean section with delivery of baby boy at 23. Apgars 8/9.    Current Outpatient Medications  Medication Instructions   atorvastatin (LIPITOR) 10 mg, Oral, Daily   metFORMIN (GLUCOPHAGE) 500 mg, Oral, 2 times daily with meals       Objective:   Physical Exam BP 136/80   Pulse 79   Temp 98.2 F (36.8 C) (Oral)   Resp 16   Ht 5' 3"$  (1.6 m)   Wt 205 lb 4 oz (93.1 kg)   LMP 03/25/2017 (Approximate)   SpO2 96%   BMI 36.36 kg/m  General:   Well developed, NAD, BMI noted. HEENT:  Normocephalic . Face symmetric, atraumatic Lungs:  CTA B Normal respiratory effort, no intercostal retractions, no accessory muscle use. Heart: RRR,  no murmur.  Lower extremities: no pretibial edema bilaterally  Skin: Not pale. Not jaundice Neurologic:  alert & oriented X3.  Speech normal, gait appropriate for age and unassisted Psych--  Cognition and judgment appear intact.  Cooperative with normal attention span and concentration.  Behavior appropriate. No anxious or depressed appearing.      Assessment     Assessment DM:  A1c 5.9 2016; A1C 6.30 August 2018 Hyperlipidemia  Menopausal, 2019 Heart murmur, normal echo 03/2020 Varicose veins: R leg slightly larger in circumference   PLAN DM: Last A1c 7.1, started metformin, good compliance and tolerance.   Check A1c, BMP, AST ALT. We had a long conversation about the need to eat healthy, decrease carbohydrates, increase fruits vegetables exercise regularly. She has no other concerns today, aware that we might need to increase metformin dose. RTC 03/2023 for CPX

## 2023-01-17 NOTE — Patient Instructions (Addendum)
Blood work today  Next visit for a physical exam May 2024   [place] (MAYO 2024)   Vaccines I recommend: Covid booster Shingrix (shingles)  Aparentemente ya es tiempo de hacerse un examen de los ojos. Por favor llame a su doctor de los ojos (ophthalmologist, optometrist) y saque una cita. Solicite que nos envien una copia de la visita a nuestro fax: [phone]. Si necesitara un "referral" nosotros lo podemos hacer

## 2023-01-18 NOTE — Assessment & Plan Note (Signed)
DM: Last A1c 7.1, started metformin, good compliance and tolerance.  Check A1c, BMP, AST ALT. We had a long conversation about the need to eat healthy, decrease carbohydrates, increase fruits vegetables exercise regularly. She has no other concerns today, aware that we might need to increase metformin dose. RTC 03/2023 for CPX

## 2023-01-20 MED ORDER — METFORMIN HCL 500 MG PO TABS: 500.0000 mg | ORAL_TABLET | Freq: Two times a day (BID) | ORAL | 5 refills | Status: DC

## 2023-01-20 NOTE — Addendum Note (Signed)
Addended byDamita Dunnings D on: 01/20/2023 09:10 AM   Modules accepted: Orders

## 2023-01-22 NOTE — Telephone Encounter (Signed)
Opened in error

## 2023-04-05 DIAGNOSIS — H524 Presbyopia: Secondary | ICD-10-CM | POA: Diagnosis not present

## 2023-04-05 DIAGNOSIS — E119 Type 2 diabetes mellitus without complications: Secondary | ICD-10-CM | POA: Diagnosis not present

## 2023-04-05 LAB — HM DIABETES EYE EXAM

## 2023-04-08 ENCOUNTER — Ambulatory Visit (INDEPENDENT_AMBULATORY_CARE_PROVIDER_SITE_OTHER): Payer: BC Managed Care – PPO | Admitting: Internal Medicine

## 2023-04-08 ENCOUNTER — Encounter: Payer: Self-pay | Admitting: Internal Medicine

## 2023-04-08 VITALS — BP 128/84 | HR 72 | Temp 98.3°F | Resp 16 | Ht 63.0 in | Wt 203.2 lb

## 2023-04-08 DIAGNOSIS — E785 Hyperlipidemia, unspecified: Secondary | ICD-10-CM | POA: Diagnosis not present

## 2023-04-08 DIAGNOSIS — E119 Type 2 diabetes mellitus without complications: Secondary | ICD-10-CM | POA: Diagnosis not present

## 2023-04-08 DIAGNOSIS — Z7984 Long term (current) use of oral hypoglycemic drugs: Secondary | ICD-10-CM | POA: Diagnosis not present

## 2023-04-08 DIAGNOSIS — Z0000 Encounter for general adult medical examination without abnormal findings: Secondary | ICD-10-CM | POA: Diagnosis not present

## 2023-04-08 DIAGNOSIS — N951 Menopausal and female climacteric states: Secondary | ICD-10-CM

## 2023-04-08 LAB — LIPID PANEL
Cholesterol: 160 mg/dL (ref 0–200)
HDL: 42.8 mg/dL (ref >39.00)
LDL Cholesterol: 89 mg/dL (ref 0–99)
NonHDL: 117.54
Total CHOL/HDL Ratio: 4
Triglycerides: 144 mg/dL (ref 0.0–149.0)
VLDL: 28.8 mg/dL (ref 0.0–40.0)

## 2023-04-08 LAB — CBC WITH DIFFERENTIAL/PLATELET
Basophils Absolute: 0 10*3/uL (ref 0.0–0.1)
Basophils Relative: 0.6 % (ref 0.0–3.0)
Eosinophils Absolute: 0.1 10*3/uL (ref 0.0–0.7)
Eosinophils Relative: 2.4 % (ref 0.0–5.0)
HCT: 37.3 % (ref 36.0–46.0)
Hemoglobin: 12 g/dL (ref 12.0–15.0)
Lymphocytes Relative: 44.1 % (ref 12.0–46.0)
Lymphs Abs: 2.6 10*3/uL (ref 0.7–4.0)
MCHC: 32.2 g/dL (ref 30.0–36.0)
MCV: 88.6 fl (ref 78.0–100.0)
Monocytes Absolute: 0.4 10*3/uL (ref 0.1–1.0)
Monocytes Relative: 6.2 % (ref 3.0–12.0)
Neutro Abs: 2.7 10*3/uL (ref 1.4–7.7)
Neutrophils Relative %: 46.7 % (ref 43.0–77.0)
Platelets: 207 10*3/uL (ref 150.0–400.0)
RBC: 4.22 Mil/uL (ref 3.87–5.11)
RDW: 13.9 % (ref 11.5–15.5)
WBC: 5.8 10*3/uL (ref 4.0–10.5)

## 2023-04-08 LAB — HEMOGLOBIN A1C: Hgb A1c MFr Bld: 6.7 % — ABNORMAL HIGH (ref 4.6–6.5)

## 2023-04-08 NOTE — Progress Notes (Unsigned)
   Subjective:    Patient ID: Amy Davidson, female    DOB: [birth year], 52 y.o.   MRN: [id number]  DOS:  04/08/2023 Type of visit - description: cpx  Since his last office visit is doing well and has no major concerns  Review of Systems See above   Past Medical History:  Diagnosis Date   Contraception    none   Diabetes mellitus without complication (HCC) 10/30/2018    Past Surgical History:  Procedure Laterality Date   CESAREAN SECTION  06/23/2011   Procedure: CESAREAN SECTION;  Surgeon: Leighton Roach Meisinger;  Location: [facility];  Service: Gynecology;  Laterality: N/A;  Primary cesarean section with delivery of baby boy at 58. Apgars 8/9.    Current Outpatient Medications  Medication Instructions   atorvastatin (LIPITOR) 10 mg, Oral, Daily   metFORMIN (GLUCOPHAGE) 500 mg, Oral, 2 times daily with meals       Objective:   Physical Exam BP 128/84   Pulse 72   Temp 98.3 F (36.8 C) (Oral)   Resp 16   Ht 5\' 3"  (1.6 m)   Wt 203 lb 4 oz (92.2 kg)   LMP 03/25/2017 (Approximate)   SpO2 95%   BMI 36.00 kg/m  General: Well developed, NAD, BMI noted Neck: No  thyromegaly  HEENT:  Normocephalic . Face symmetric, atraumatic Lungs:  CTA B Normal respiratory effort, no intercostal retractions, no accessory muscle use. Heart: RRR, soft, systolic murmur. Abdomen:  Not distended, soft, non-tender. No rebound or rigidity.   DM foot exam: No edema, good pedal pulses, pinprick examination normal Skin: Exposed areas without rash. Not pale. Not jaundice Neurologic:  alert & oriented X3.  Speech normal, gait appropriate for age and unassisted Strength symmetric and appropriate for age.  Psych: Cognition and judgment appear intact.  Cooperative with normal attention span and concentration.  Behavior appropriate. No anxious or depressed appearing.     Assessment     Assessment DM:  A1c 5.9 2016; A1C 6.30 August 2018 Hyperlipidemia  Menopausal, 2019 Heart murmur, normal  echo 03/2020 Varicose veins: R leg slightly larger in circumference   PLAN Here for CPX, see separate documentation DM: On metformin, feet exam negative, check A1c Hyperlipidemia: On Lipitor, check FLP. Systolic murmur: Seems send change.  Normal echo 2021. All instructions discussed with the patient, she verbalized understanding.  RTC 4 to 5 months Td 2022 PNM 23 : 12-2018 Vaccines I recommend: Shingrix, COVID booster if not done after 07-2022, flu shot every fall  Female care:   - per gyn, LOV 2023 - MMG 09/2022  per [facility]  - Bones Menopausal 2019, rec DEXA and cint Vit D OTC, h/o  wnl Vit D   NWG:NFAO (-) 03-2021, 2023.  Options discussed, elected and no other ifob  Labs:    FLP CBC A1c DEXA  Diet and exercise: Discussed    === DM: Last A1c 7.1, started metformin, good compliance and tolerance.  Check A1c, BMP, AST ALT. We had a long conversation about the need to eat healthy, decrease carbohydrates, increase fruits vegetables exercise regularly. She has no other concerns today, aware that we might need to increase metformin dose. RTC 03/2023 for CPX

## 2023-04-08 NOTE — Patient Instructions (Addendum)
VAYA AL LABORATORIO PARA DAR UNA MUESTRA DE SANGRE  SAQUE UNA CITA PRA REGRESAR EN 4-5 MESES  LLAME PARA SACAR UNA CITA PARA UNA DENSIOMETRIA OSEA "DEXA" EN EL PRIMER PISO [phone]  Vaccines I recommend: Covid booster Shingrix (shingles)        Aparentemente ya es tiempo de hacerse un examen de los ojos. Por favor llame a su doctor de los ojos (ophthalmologist, optometrist) y saque una cita. Solicite que nos envien una copia de la visita a nuestro fax: [phone]. Si necesitara un "referral" nosotros lo podemos hacer

## 2023-04-09 ENCOUNTER — Encounter: Payer: Self-pay | Admitting: Internal Medicine

## 2023-04-09 NOTE — Assessment & Plan Note (Signed)
Td 2022 PNM 23 : 12-2018 Vaccines I recommend: Shingrix, COVID booster if not done after 07-2022, flu shot every fall Female care:   - per gyn, LOV 2023 - MMG 09/2022  per [facility]  - Bones Menopausal 2019, rec DEXA and cont Vit D OTC, no h/o  vit d def  ZOX:WRUE (-) 03-2021, 2023.  Options discussed, elected  ifob Labs:    FLP CBC A1c  Diet and exercise: Discussed

## 2023-04-09 NOTE — Assessment & Plan Note (Signed)
Here for CPX, see separate documentation DM: On metformin, feet exam negative, check A1c Hyperlipidemia: On Lipitor, check FLP. Systolic murmur: Seems unchange.  Normal echo 2021. RTC 4 to 5 months All instructions d/w pt in spanish, she verbalized understanding.

## 2023-04-11 ENCOUNTER — Other Ambulatory Visit (INDEPENDENT_AMBULATORY_CARE_PROVIDER_SITE_OTHER): Payer: BC Managed Care – PPO

## 2023-04-11 DIAGNOSIS — Z0000 Encounter for general adult medical examination without abnormal findings: Secondary | ICD-10-CM

## 2023-04-11 LAB — FECAL OCCULT BLOOD, IMMUNOCHEMICAL: Fecal Occult Bld: NEGATIVE

## 2023-05-02 ENCOUNTER — Ambulatory Visit: Payer: BC Managed Care – PPO | Admitting: Internal Medicine

## 2023-09-30 ENCOUNTER — Other Ambulatory Visit: Payer: Self-pay | Admitting: Internal Medicine

## 2023-09-30 DIAGNOSIS — E7800 Pure hypercholesterolemia, unspecified: Secondary | ICD-10-CM

## 2023-10-10 ENCOUNTER — Ambulatory Visit: Payer: BC Managed Care – PPO | Admitting: Internal Medicine

## 2023-10-10 ENCOUNTER — Encounter: Payer: Self-pay | Admitting: Internal Medicine

## 2023-10-10 VITALS — BP 126/82 | HR 79 | Temp 98.2°F | Resp 16 | Ht 63.0 in | Wt 190.2 lb

## 2023-10-10 DIAGNOSIS — E119 Type 2 diabetes mellitus without complications: Secondary | ICD-10-CM | POA: Diagnosis not present

## 2023-10-10 DIAGNOSIS — Z7984 Long term (current) use of oral hypoglycemic drugs: Secondary | ICD-10-CM

## 2023-10-10 DIAGNOSIS — Z780 Asymptomatic menopausal state: Secondary | ICD-10-CM

## 2023-10-10 LAB — MICROALBUMIN / CREATININE URINE RATIO
Creatinine,U: 99 mg/dL
Microalb Creat Ratio: 0.7 mg/g (ref 0.0–30.0)
Microalb, Ur: <0.7 mg/dL (ref 0.0–1.9)

## 2023-10-10 LAB — BASIC METABOLIC PANEL
BUN: 13 mg/dL (ref 6–23)
CO2: 31 meq/L (ref 19–32)
Calcium: 9.4 mg/dL (ref 8.4–10.5)
Chloride: 103 meq/L (ref 96–112)
Creatinine, Ser: 0.76 mg/dL (ref 0.40–1.20)
GFR: 89.84 mL/min (ref >60.00)
Glucose, Bld: 96 mg/dL (ref 70–99)
Potassium: 3.9 meq/L (ref 3.5–5.1)
Sodium: 141 meq/L (ref 135–145)

## 2023-10-10 LAB — HEMOGLOBIN A1C: Hgb A1c MFr Bld: 6.6 % — ABNORMAL HIGH (ref 4.6–6.5)

## 2023-10-10 NOTE — Patient Instructions (Addendum)
Llame a este numero para la cita de la densiometria osea [phone]  GO TO THE LAB : Get the blood work     Next visit with me in 6 months for a physical exam Please schedule it at the front desk

## 2023-10-10 NOTE — Progress Notes (Unsigned)
   Subjective:    Patient ID: Amy Davidson, female    DOB: 1971-10-25, 52 y.o.   MRN: [id number]  DOS:  10/10/2023 Type of visit - description: f/u  Has no concerns or complaints. Denies chest pain or difficulty breathing. No ambulatory CBGs. Good med compliance  Review of Systems See above   Past Medical History:  Diagnosis Date   Contraception    none   Diabetes mellitus without complication (HCC) 10/30/2018    Past Surgical History:  Procedure Laterality Date   CESAREAN SECTION  06/23/2011   Procedure: CESAREAN SECTION;  Surgeon: Leighton Roach Meisinger;  Location: [facility];  Service: Gynecology;  Laterality: N/A;  Primary cesarean section with delivery of baby boy at 32. Apgars 8/9.    Current Outpatient Medications  Medication Instructions   atorvastatin (LIPITOR) 10 mg, Oral, Daily   metFORMIN (GLUCOPHAGE) 500 mg, Oral, 2 times daily with meals       Objective:   Physical Exam BP 126/82   Pulse 79   Temp 98.2 F (36.8 C) (Oral)   Resp 16   Ht 5\' 3"  (1.6 m)   Wt 190 lb 4 oz (86.3 kg)   LMP 03/25/2017 (Approximate)   SpO2 97%   BMI 33.70 kg/m  General:   Well developed, NAD, BMI noted. HEENT:  Normocephalic . Face symmetric, atraumatic Lungs:  CTA B Normal respiratory effort, no intercostal retractions, no accessory muscle use. Heart: RRR,  soft systolic murmur.  Lower extremities: no pretibial edema bilaterally  Skin: Not pale. Not jaundice Neurologic:  alert & oriented X3.  Speech normal, gait appropriate for age and unassisted Psych--  Cognition and judgment appear intact.  Cooperative with normal attention span and concentration.  Behavior appropriate. No anxious or depressed appearing.      Assessment   Assessment DM:  A1c 5.9 2016; A1C 6.30 August 2018 Hyperlipidemia  Menopausal, 2019 Heart murmur, normal echo 03/2020 Varicose veins: R leg slightly larger in circumference   PLAN DM, on metformin, no ambulatory CBGs, reports healthy diet,  remains active, check A1c and BMP Hyperlipidemia: Controlled on Lipitor. Menopausal, proceed with DEXA, instructions provided. Preventive care: Declines flu and COVID-vaccine, explained pros>cons RTC 6 months   Here for CPX, see separate documentation DM: On metformin, feet exam negative, check A1c Hyperlipidemia: On Lipitor, check FLP. Systolic murmur: Seems unchange.  Normal echo 2021. RTC 4 to 5 months All instructions d/w pt in spanish, she verbalized understanding.

## 2023-10-11 NOTE — Assessment & Plan Note (Signed)
DM, on metformin, no ambulatory CBGs, reports healthy diet, remains active, check A1c and BMP Hyperlipidemia: Controlled on Lipitor. Menopausal, proceed with DEXA, instructions provided. Preventive care: Declines flu and COVID-vaccine, explained pros>cons RTC 6 months

## 2023-11-14 DIAGNOSIS — A150 Tuberculosis of lung: Secondary | ICD-10-CM | POA: Diagnosis not present

## 2023-12-17 DIAGNOSIS — Z1231 Encounter for screening mammogram for malignant neoplasm of breast: Secondary | ICD-10-CM | POA: Diagnosis not present

## 2023-12-17 DIAGNOSIS — Z130 Encounter for screening for diseases of the blood and blood-forming organs and certain disorders involving the immune mechanism: Secondary | ICD-10-CM | POA: Diagnosis not present

## 2023-12-17 DIAGNOSIS — Z01419 Encounter for gynecological examination (general) (routine) without abnormal findings: Secondary | ICD-10-CM | POA: Diagnosis not present

## 2023-12-17 LAB — HM MAMMOGRAPHY

## 2023-12-19 ENCOUNTER — Encounter: Payer: Self-pay | Admitting: Internal Medicine

## 2024-01-27 ENCOUNTER — Other Ambulatory Visit: Payer: Self-pay | Admitting: Internal Medicine

## 2024-01-27 DIAGNOSIS — E119 Type 2 diabetes mellitus without complications: Secondary | ICD-10-CM

## 2024-04-13 ENCOUNTER — Encounter: Payer: Self-pay | Admitting: Internal Medicine

## 2024-04-13 ENCOUNTER — Ambulatory Visit (INDEPENDENT_AMBULATORY_CARE_PROVIDER_SITE_OTHER): Payer: BC Managed Care – PPO | Admitting: Internal Medicine

## 2024-04-13 VITALS — BP 132/70 | HR 63 | Temp 98.1°F | Resp 16 | Ht 63.0 in | Wt 191.1 lb

## 2024-04-13 DIAGNOSIS — Z0000 Encounter for general adult medical examination without abnormal findings: Secondary | ICD-10-CM

## 2024-04-13 DIAGNOSIS — E119 Type 2 diabetes mellitus without complications: Secondary | ICD-10-CM | POA: Diagnosis not present

## 2024-04-13 DIAGNOSIS — Z780 Asymptomatic menopausal state: Secondary | ICD-10-CM

## 2024-04-13 DIAGNOSIS — E785 Hyperlipidemia, unspecified: Secondary | ICD-10-CM

## 2024-04-13 DIAGNOSIS — Z7984 Long term (current) use of oral hypoglycemic drugs: Secondary | ICD-10-CM

## 2024-04-13 LAB — CBC WITH DIFFERENTIAL/PLATELET
Basophils Absolute: 0 10*3/uL (ref 0.0–0.1)
Basophils Relative: 1 % (ref 0.0–3.0)
Eosinophils Absolute: 0.1 10*3/uL (ref 0.0–0.7)
Eosinophils Relative: 2.6 % (ref 0.0–5.0)
HCT: 36.4 % (ref 36.0–46.0)
Hemoglobin: 12 g/dL (ref 12.0–15.0)
Lymphocytes Relative: 50.9 % — ABNORMAL HIGH (ref 12.0–46.0)
Lymphs Abs: 2.6 10*3/uL (ref 0.7–4.0)
MCHC: 32.9 g/dL (ref 30.0–36.0)
MCV: 86.6 fl (ref 78.0–100.0)
Monocytes Absolute: 0.3 10*3/uL (ref 0.1–1.0)
Monocytes Relative: 6.5 % (ref 3.0–12.0)
Neutro Abs: 2 10*3/uL (ref 1.4–7.7)
Neutrophils Relative %: 39 % — ABNORMAL LOW (ref 43.0–77.0)
Platelets: 194 10*3/uL (ref 150.0–400.0)
RBC: 4.2 Mil/uL (ref 3.87–5.11)
RDW: 13.7 % (ref 11.5–15.5)
WBC: 5.1 10*3/uL (ref 4.0–10.5)

## 2024-04-13 LAB — HEMOGLOBIN A1C: Hgb A1c MFr Bld: 6.6 % — ABNORMAL HIGH (ref 4.6–6.5)

## 2024-04-13 NOTE — Assessment & Plan Note (Signed)
 Here for CPX  Other issues addressed today DM: Last A1c 6.6.  Continue metformin .  Check labs High cholesterol: On atorvastatin .  Labs. Menopausal: Ordering a bone density test, encouraged to proceed,  continue vitamin D .   RTC 4 months

## 2024-04-13 NOTE — Patient Instructions (Addendum)
 [place]:  Flu shot  Shingrix  Vitamina D : 1000 unidades al dia   GO TO THE LAB :  Get the blood work   Your results will be posted on MyChart with my comments  Next office visit for a checkup in 4 months Please make an appointment before you leave today    DEXA: densiometria osea           Aparentemente ya es tiempo de hacerse un examen de los ojos. Por favor llame a su doctor de los ojos (ophthalmologist, optometrist) y saque una cita. Solicite que nos envien una copia de la visita a nuestro fax: [phone]. Si necesitara un "referral" nosotros lo podemos hacer

## 2024-04-13 NOTE — Progress Notes (Signed)
 Subjective:    Patient ID: Amy Davidson, female    DOB: 1971-02-13, 53 y.o.   MRN: [id number]  DOS:  04/13/2024 Type of visit - description: CPX  Here for CPX. No major concerns.  Review of Systems   A 14 point review of systems is negative     Past Medical History:  Diagnosis Date   Contraception    none   Diabetes mellitus without complication (HCC) 10/30/2018    Past Surgical History:  Procedure Laterality Date   CESAREAN SECTION  06/23/2011   Procedure: CESAREAN SECTION;  Surgeon: Demetra Filter Meisinger;  Location: [facility];  Service: Gynecology;  Laterality: N/A;  Primary cesarean section with delivery of baby boy at 57. Apgars 8/9.   Social History   Socioeconomic History   Marital status: Married    Spouse name: Not on file   Number of children: 5   Years of education: Not on file   Highest education level: Not on file  Occupational History   Occupation: factory job  Tobacco Use   Smoking status: Never   Smokeless tobacco: Never  Substance and Sexual Activity   Alcohol use: No    Alcohol/week: 0.0 standard drinks of alcohol   Drug use: No   Sexual activity: Not on file  Other Topics Concern   Not on file  Social History Narrative   Married, lives w/ husband and 5 children, from [place]   Oldest born 2000   Household: pt, husband, 4 children    Social Drivers of Corporate investment banker Strain: Not on file  Food Insecurity: Not on file  Transportation Needs: Not on file  Physical Activity: Not on file  Stress: Not on file  Social Connections: Not on file  Intimate Partner Violence: Not on file    Current Outpatient Medications  Medication Instructions   atorvastatin  (LIPITOR) 10 mg, Oral, Daily   metFORMIN  (GLUCOPHAGE ) 500 mg, Oral, 2 times daily with meals       Objective:   Physical Exam BP 132/70   Pulse 63   Temp 98.1 F (36.7 C) (Oral)   Resp 16   Ht 5\' 3"  (1.6 m)   Wt 191 lb 2 oz (86.7 kg)   LMP 03/25/2017 (Approximate)    SpO2 96%   BMI 33.86 kg/m  General: Well developed, NAD, BMI noted Neck: No  thyromegaly  HEENT:  Normocephalic . Face symmetric, atraumatic Lungs:  CTA B Normal respiratory effort, no intercostal retractions, no accessory muscle use. Heart: RRR,  no murmur.  Abdomen:  Not distended, soft, non-tender. No rebound or rigidity.   DM foot exam: No edema, good pedal pulses, pinprick examination normal. Skin: Exposed areas without rash. Not pale. Not jaundice Neurologic:  alert & oriented X3.  Speech normal, gait appropriate for age and unassisted Strength symmetric and appropriate for age.  Psych: Cognition and judgment appear intact.  Cooperative with normal attention span and concentration.  Behavior appropriate. No anxious or depressed appearing.     Assessment     Assessment DM:  A1c 5.9 2016; A1C 6.30 August 2018 Hyperlipidemia  Menopausal, 2019 Heart murmur, normal echo 03/2020 Varicose veins: R leg slightly larger in circumference   PLAN Here for CPX Td 2022 PNM 23 : 12-2018 COVID-vaccine 11-2023 per patient. Vaccines I recommend: Shingrix, flu shot every fall Female care:   - per gyn, LOV 2023 - MMG 09/2022  per [facility].  Had a mammogram 11-2023 per patient. - Bones Menopausal 2019, rec  DEXA, vitamin D  OTC.  ZOX:WRUE (-) 03-2021, 2023, 03/2024.  3 options discussed, pros and cons, elected to continue  w/ IFOB Labs: CMP FLP CBC A1c Diet and exercise: Encouraged more exercise Other issues addressed today DM: Last A1c 6.6.  Continue metformin .  Check labs High cholesterol: On atorvastatin .  Labs. Menopausal: Ordering a bone density test, encouraged to proceed,  continue vitamin D .   RTC 4 months

## 2024-04-13 NOTE — Assessment & Plan Note (Signed)
 Here for CPX Td 2022 PNM 23 : 12-2018 COVID-vaccine 11-2023 per patient. Vaccines I recommend: Shingrix, flu shot every fall Female care:   - per gyn, LOV 2023 - MMG 09/2022  per [facility].  Had a mammogram 11-2023 per patient. - Bones Menopausal 2019, rec DEXA, vitamin D  OTC.  ZOX:WRUE (-) 03-2021, 2023, 03/2024.  3 options discussed, pros and cons, elected to continue  w/ IFOB Labs: CMP FLP CBC A1c Diet and exercise: Encouraged more exercise

## 2024-04-13 NOTE — Addendum Note (Signed)
 Addended by: Jc Veron D on: 04/13/2024 08:45 AM   Modules accepted: Orders

## 2024-04-14 LAB — COMPREHENSIVE METABOLIC PANEL WITH GFR
ALT: 12 U/L (ref 0–35)
AST: 16 U/L (ref 0–37)
Albumin: 4.7 g/dL (ref 3.5–5.2)
Alkaline Phosphatase: 77 U/L (ref 39–117)
BUN: 14 mg/dL (ref 6–23)
CO2: 21 meq/L (ref 19–32)
Calcium: 9.4 mg/dL (ref 8.4–10.5)
Chloride: 107 meq/L (ref 96–112)
Creatinine, Ser: 0.63 mg/dL (ref 0.40–1.20)
GFR: 101.34 mL/min (ref >60.00)
Glucose, Bld: 81 mg/dL (ref 70–99)
Potassium: 4.3 meq/L (ref 3.5–5.1)
Sodium: 145 meq/L (ref 135–145)
Total Bilirubin: 0.3 mg/dL (ref 0.2–1.2)
Total Protein: 7.4 g/dL (ref 6.0–8.3)

## 2024-04-14 LAB — LIPID PANEL
Cholesterol: 137 mg/dL (ref 0–200)
HDL: 51.3 mg/dL (ref >39.00)
LDL Cholesterol: 70 mg/dL (ref 0–99)
NonHDL: 85.93
Total CHOL/HDL Ratio: 3
Triglycerides: 81 mg/dL (ref 0.0–149.0)
VLDL: 16.2 mg/dL (ref 0.0–40.0)

## 2024-04-15 ENCOUNTER — Ambulatory Visit: Payer: Self-pay | Admitting: Internal Medicine

## 2024-04-24 ENCOUNTER — Other Ambulatory Visit: Payer: Self-pay | Admitting: Internal Medicine

## 2024-04-24 DIAGNOSIS — E7800 Pure hypercholesterolemia, unspecified: Secondary | ICD-10-CM

## 2024-04-27 ENCOUNTER — Telehealth: Payer: Self-pay | Admitting: Internal Medicine

## 2024-04-27 NOTE — Telephone Encounter (Signed)
 Copied from CRM [id number]. Topic: Referral - Request for Referral >> Apr 27, 2024  4:18 PM Jim Motts C wrote: Did the patient discuss referral with their provider in the last year? Yes (If No - schedule appointment) (If Yes - send message)  Appointment offered? No  Type of order/referral and detailed reason for visit: Husband of wife/patient called in to state that patient needs a new referral for x-ray imaging. The place that patient was referred at first advised that they no longer do x-rays. Patient is requesting a new location/referral.   Preference of office, provider, location:   If referral order, have you been seen by this specialty before?  (If Yes, this issue or another issue? When? Where?  Can we respond through MyChart? Yes

## 2024-04-27 NOTE — Telephone Encounter (Signed)
 Amy Davidson- can you call to see what x-ray is for? I know she needs a repeat bone density but scanner is down- we are planning to call her once bone density machine is working again.

## 2024-04-28 NOTE — Telephone Encounter (Signed)
 Called patient a few times but no answer and no voice mail

## 2024-04-29 NOTE — Telephone Encounter (Signed)
 Called patient but no answer, left voice mail for patient to call back.  Message was recorded in spanish

## 2024-04-30 NOTE — Telephone Encounter (Signed)
 Called patient and emergency contact, no answer on either number.

## 2024-05-08 DIAGNOSIS — E119 Type 2 diabetes mellitus without complications: Secondary | ICD-10-CM | POA: Diagnosis not present

## 2024-05-08 DIAGNOSIS — H524 Presbyopia: Secondary | ICD-10-CM | POA: Diagnosis not present

## 2024-05-11 ENCOUNTER — Encounter: Payer: Self-pay | Admitting: Internal Medicine

## 2024-07-25 ENCOUNTER — Other Ambulatory Visit: Payer: Self-pay | Admitting: Internal Medicine

## 2024-07-25 DIAGNOSIS — E119 Type 2 diabetes mellitus without complications: Secondary | ICD-10-CM

## 2024-07-26 ENCOUNTER — Other Ambulatory Visit: Payer: Self-pay | Admitting: Internal Medicine

## 2024-07-26 DIAGNOSIS — E7800 Pure hypercholesterolemia, unspecified: Secondary | ICD-10-CM

## 2024-08-03 ENCOUNTER — Other Ambulatory Visit: Payer: Self-pay | Admitting: Internal Medicine

## 2024-08-03 DIAGNOSIS — E119 Type 2 diabetes mellitus without complications: Secondary | ICD-10-CM

## 2024-08-03 NOTE — Telephone Encounter (Signed)
 Copied from CRM [id number]. Topic: Clinical - Medication Refill >> Aug 03, 2024 11:52 AM Mia F wrote: Medication: metFORMIN  (GLUCOPHAGE ) 500 MG tablet   Has the patient contacted their pharmacy? Yes (Agent: If no, request that the patient contact the pharmacy for the refill. If patient does not wish to contact the pharmacy document the reason why and proceed with request.) (Agent: If yes, when and what did the pharmacy advise?)  This is the patient's preferred pharmacy:  [id number] [address], [place] - [address] [place] [address] Phone: [phone] Fax: [phone]  Is this the correct pharmacy for this prescription? Yes If no, delete pharmacy and type the correct one.   Has the prescription been filled recently? No  Is the patient out of the medication? Yes  Has the patient been seen for an appointment in the last year OR does the patient have an upcoming appointment? Yes  Can we respond through MyChart? No  Agent: Please be advised that Rx refills may take up to 3 business days. We ask that you follow-up with your pharmacy.

## 2024-08-03 NOTE — Telephone Encounter (Signed)
 Rx was refilled on 07/27/24.

## 2024-09-30 NOTE — Progress Notes (Unsigned)
 Subjective:     Patient ID: Amy Davidson, female    DOB: 06-Oct-1971, 53 y.o.   MRN: [id number]  No chief complaint on file.   HPI  Discussed the use of AI scribe software for clinical note transcription with the patient, who gave verbal consent to proceed.  DM type II-on metformin  500 mg twice daily  HLD-atorvastatin  10 mg daily  Patient denies fever, chills, SOB, CP, palpitations, dyspnea, edema, HA, vision changes, N/V/D, abdominal pain, urinary symptoms, rash, weight changes, and recent illness or hospitalizations.    Past Medical History:  Diagnosis Date   Contraception    none   Diabetes mellitus without complication (HCC) 10/30/2018    LMP 03/25/2017 (Approximate)  Gen.: Awake, alert, appears stated age HEENT: Mucous membranes moist without mucosal lesions Heart: Regular rate and rhythm without murmurs Lungs: Clear auscultation bilaterally, no rales or wheezing, normal effort without accessory muscle use. Abdomen: Bowel sounds are present. Abdomen is soft, nontender, nondistended, no masses or organomegaly. Negative Murphy's, Rovsing's, McBurney's, and Carnett's sign. Psych: Age appropriate judgment and insight. Normal mood and affect.  Diabetes mellitus without complication (HCC)  Hyperlipidemia, unspecified hyperlipidemia type  Orders as above. F/u *** Pt voiced understanding and agreement to the plan.  [name], DNP, AGNP-C 09/30/24 12:33 PM  den  History of Present Illness              Health Maintenance Due  Topic Date Due   Diabetic kidney evaluation - Urine ACR  Never done   Pneumococcal Vaccine: 50+ Years (2 of 2 - PCV) 01/07/2020   Zoster Vaccines- Shingrix (1 of 2) Never done   Hepatitis B Vaccines 19-59 Average Risk (2 of 2 - CpG 2-dose series) 12/13/2023   COLON CANCER SCREENING ANNUAL FOBT  04/10/2024   Influenza Vaccine  06/25/2024    Past Medical History:  Diagnosis Date   Contraception    none   Diabetes mellitus  without complication (HCC) 10/30/2018    Past Surgical History:  Procedure Laterality Date   CESAREAN SECTION  06/23/2011   Procedure: CESAREAN SECTION;  Surgeon: Krystal BIRCH Meisinger;  Location: [facility];  Service: Gynecology;  Laterality: N/A;  Primary cesarean section with delivery of baby boy at 45. Apgars 8/9.    Family History  Problem Relation Age of Onset   Diabetes Mother    Breast cancer Cousin    Breast cancer Cousin    CAD Neg Hx    Colon cancer Neg Hx     Social History   Socioeconomic History   Marital status: Married    Spouse name: Not on file   Number of children: 5   Years of education: Not on file   Highest education level: Not on file  Occupational History   Occupation: factory job  Tobacco Use   Smoking status: Never   Smokeless tobacco: Never  Substance and Sexual Activity   Alcohol use: No    Alcohol/week: 0.0 standard drinks of alcohol   Drug use: No   Sexual activity: Not on file  Other Topics Concern   Not on file  Social History Narrative   Married, lives w/ husband and 5 children, from [place].   Oldest born 2000   Household: pt, husband, 4 children    Social Drivers of Corporate Investment Banker Strain: Not on file  Food Insecurity: Not on file  Transportation Needs: Not on file  Physical Activity: Not on file  Stress: Not on file  Social Connections: Not on file  Intimate Partner Violence: Not on file    Outpatient Medications Prior to Visit  Medication Sig Dispense Refill   atorvastatin  (LIPITOR) 10 MG tablet Take 1 tablet (10 mg total) by mouth daily. Needs appt 30 tablet 0   metFORMIN  (GLUCOPHAGE ) 500 MG tablet Take 1 tablet (500 mg total) by mouth 2 (two) times daily with a meal. Needs appt 60 tablet 0   No facility-administered medications prior to visit.    No Known Allergies  ROS     Objective:    Physical Exam Vitals reviewed.  Constitutional:      General: She is not in acute distress.    Appearance: She is  not toxic-appearing.  HENT:     Head: Normocephalic and atraumatic.     Mouth/Throat:     Mouth: Mucous membranes are moist.     Pharynx: Oropharynx is clear.  Eyes:     Extraocular Movements: Extraocular movements intact.     Pupils: Pupils are equal, round, and reactive to light.  Cardiovascular:     Rate and Rhythm: Normal rate and regular rhythm.     Pulses: Normal pulses.     Heart sounds: Normal heart sounds. No murmur heard. Pulmonary:     Effort: Pulmonary effort is normal. No respiratory distress.     Breath sounds: Normal breath sounds. No wheezing.  Musculoskeletal:        General: No swelling.     Cervical back: Neck supple.  Skin:    General: Skin is warm and dry.  Neurological:     General: No focal deficit present.     Mental Status: She is alert and oriented to person, place, and time.  Psychiatric:        Mood and Affect: Mood normal.        Behavior: Behavior normal.        Thought Content: Thought content normal.        Judgment: Judgment normal.      LMP 03/25/2017 (Approximate)  Wt Readings from Last 3 Encounters:  04/13/24 191 lb 2 oz (86.7 kg)  10/10/23 190 lb 4 oz (86.3 kg)  04/08/23 203 lb 4 oz (92.2 kg)       Assessment & Plan:   Problem List Items Addressed This Visit     Diabetes mellitus without complication (HCC) - Primary   Hyperlipidemia    I am having Amy Davidson maintain her metFORMIN  and atorvastatin .  No orders of the defined types were placed in this encounter.

## 2024-09-30 NOTE — Assessment & Plan Note (Addendum)
 Tolerating statin. Encourage heart healthy diet such as MIND or DASH diet, increase exercise, avoid trans fats, simple carbohydrates and processed foods, consider a krill or fish or flaxseed oil cap daily.

## 2024-09-30 NOTE — Assessment & Plan Note (Signed)
 hgba1c acceptable, minimize simple carbs. Increase exercise as tolerated. Continue current meds

## 2024-10-01 ENCOUNTER — Ambulatory Visit: Payer: Self-pay | Admitting: Student

## 2024-10-01 ENCOUNTER — Encounter: Payer: Self-pay | Admitting: Student

## 2024-10-01 VITALS — BP 133/84 | HR 77 | Ht 63.0 in | Wt 193.8 lb

## 2024-10-01 DIAGNOSIS — E785 Hyperlipidemia, unspecified: Secondary | ICD-10-CM

## 2024-10-01 DIAGNOSIS — E7800 Pure hypercholesterolemia, unspecified: Secondary | ICD-10-CM

## 2024-10-01 DIAGNOSIS — E119 Type 2 diabetes mellitus without complications: Secondary | ICD-10-CM

## 2024-10-01 DIAGNOSIS — Z7984 Long term (current) use of oral hypoglycemic drugs: Secondary | ICD-10-CM

## 2024-10-01 LAB — LIPID PANEL
Cholesterol: 258 mg/dL — ABNORMAL HIGH (ref 0–200)
HDL: 50.2 mg/dL (ref >39.00)
LDL Cholesterol: 178 mg/dL — ABNORMAL HIGH (ref 0–99)
NonHDL: 207.58
Total CHOL/HDL Ratio: 5
Triglycerides: 148 mg/dL (ref 0.0–149.0)
VLDL: 29.6 mg/dL (ref 0.0–40.0)

## 2024-10-01 LAB — CBC
HCT: 37.6 % (ref 36.0–46.0)
Hemoglobin: 12.3 g/dL (ref 12.0–15.0)
MCHC: 32.6 g/dL (ref 30.0–36.0)
MCV: 87 fl (ref 78.0–100.0)
Platelets: 217 K/uL (ref 150.0–400.0)
RBC: 4.32 Mil/uL (ref 3.87–5.11)
RDW: 14.4 % (ref 11.5–15.5)
WBC: 5 K/uL (ref 4.0–10.5)

## 2024-10-01 LAB — MICROALBUMIN / CREATININE URINE RATIO
Creatinine,U: 155.6 mg/dL
Microalb Creat Ratio: 8.2 mg/g (ref 0.0–30.0)
Microalb, Ur: 1.3 mg/dL (ref 0.0–1.9)

## 2024-10-01 LAB — HEMOGLOBIN A1C: Hgb A1c MFr Bld: 6.8 % — ABNORMAL HIGH (ref 4.6–6.5)

## 2024-10-01 MED ORDER — ATORVASTATIN CALCIUM 10 MG PO TABS: 10.0000 mg | ORAL_TABLET | Freq: Every day | ORAL | 1 refills | Status: DC

## 2024-10-01 MED ORDER — METFORMIN HCL 500 MG PO TABS: 500.0000 mg | ORAL_TABLET | Freq: Two times a day (BID) | ORAL | 1 refills | Status: AC

## 2024-10-01 NOTE — Progress Notes (Signed)
 Subjective:  No chief complaint on file.     History of Present Illness Amy Davidson is a 53 year old female who presents for medication refill and follow-up. Presents with interpreter to OV. Overall doing well. Taking medications as prescribed, denies adverse Ses.   DM type II-on metformin  500 mg twice daily - Compliant with medications - Denies symptoms of hypoglycemia, polyuria, polydipsia, numbness extremities, foot ulcers/trauma, visual changes, wounds that are not healing, medication side effects   HLD-atorvastatin  10 mg daily  Patient denies fever, chills, SOB, CP, palpitations, dyspnea, edema, HA, vision changes, N/V/D, abdominal pain, urinary symptoms, rash, weight changes, and recent illness or hospitalizations.    Past Medical History:  Diagnosis Date   Contraception    none   Diabetes mellitus without complication (HCC) 10/30/2018      Objective:  BP 133/84   Pulse 77   Ht 5' 3 (1.6 m)   Wt 193 lb 12.8 oz (87.9 kg)   LMP 03/25/2017 (Approximate)   SpO2 97%   BMI 34.33 kg/m  General:  Well developed, well nourished, in no apparent distress Skin:  Warm, no pallor or diaphoresis Head:  Normocephalic, atraumatic Eyes:  Pupils equal and round, sclera anicteric without injection  Lungs:  CTAB, no access msc use Cardio:  RRR, no bruits, no LE edema Musculoskeletal:  Symmetrical muscle groups noted without atrophy or deformity Neuro:  Sensation intact to pinprick on feet Psych: Age appropriate judgment and insight  Assessment:   Diabetes mellitus without complication (HCC) - Plan: Urine Microalbumin w/creat. ratio, HgB A1c, CBC, metFORMIN  (GLUCOPHAGE ) 500 MG tablet  Hyperlipidemia, unspecified hyperlipidemia type - Plan: Lipid panel  Elevated LDL cholesterol level - Plan: atorvastatin  (LIPITOR) 10 MG tablet   Plan:   DM Type 2 hgba1c acceptable, minimize simple carbs. Increase exercise as tolerated. Continue current meds.  Rx refilled  HLD Tolerating  Statin. Encourage heart healthy diet such as MIND or DASH diet, increase exercise, avoid trans fats, simple carbohydrates and processed foods, consider a krill or fish or flaxseed oil cap daily.   Rx refilled   Counseled on diet and exercise. F/u in 6 mo for CPE The patient voiced understanding and agreement to the plan.  [name], DNP, AGNP-C 10/01/24 11:38 AM

## 2024-10-04 ENCOUNTER — Ambulatory Visit: Payer: Self-pay | Admitting: Student

## 2024-10-04 DIAGNOSIS — E7800 Pure hypercholesterolemia, unspecified: Secondary | ICD-10-CM

## 2024-10-04 MED ORDER — ATORVASTATIN CALCIUM 20 MG PO TABS: 20.0000 mg | ORAL_TABLET | Freq: Every day | ORAL | 1 refills | Status: AC

## 2024-10-04 NOTE — Telephone Encounter (Signed)
 Levorn- can you try reaching out to Pt again? Needs f/u appt in 3 month w/ Dr. Amon. E2C2 did not schedule it. Thank you.

## 2024-10-04 NOTE — Telephone Encounter (Signed)
 Copied from CRM [id number]. Topic: Clinical - Lab/Test Results >> Oct 04, 2024  4:43 PM [name] wrote: Reason for CRM: Patient is returning a call for her lab results. I was able to successfully relay her results.

## 2024-10-04 NOTE — Progress Notes (Signed)
 Called patient but no answer, left voice mail for patient to call back.

## 2024-10-05 NOTE — Progress Notes (Signed)
 Called patient again at home and cell numbers a few times,still no answer

## 2024-10-05 NOTE — Progress Notes (Signed)
 Called patient a few times at both numbers listed but no answer. Lvm again on cell #

## 2025-01-28 ENCOUNTER — Ambulatory Visit: Admitting: Internal Medicine
